# Patient Record
Sex: Female | Born: 1990 | Race: Black or African American | Hispanic: No | Marital: Single | Smoking: Current every day smoker
Health system: Southern US, Community
[De-identification: ages and names within clinical notes are randomized; demographics above are authoritative.]

## PROBLEM LIST (undated history)

## (undated) ENCOUNTER — Inpatient Hospital Stay (HOSPITAL_COMMUNITY): Payer: Self-pay

## (undated) DIAGNOSIS — A599 Trichomoniasis, unspecified: Secondary | ICD-10-CM

## (undated) DIAGNOSIS — F32A Depression, unspecified: Secondary | ICD-10-CM

## (undated) DIAGNOSIS — R58 Hemorrhage, not elsewhere classified: Secondary | ICD-10-CM

## (undated) DIAGNOSIS — J45909 Unspecified asthma, uncomplicated: Secondary | ICD-10-CM

## (undated) DIAGNOSIS — F329 Major depressive disorder, single episode, unspecified: Secondary | ICD-10-CM

## (undated) DIAGNOSIS — F419 Anxiety disorder, unspecified: Secondary | ICD-10-CM

## (undated) DIAGNOSIS — Z5189 Encounter for other specified aftercare: Secondary | ICD-10-CM

## (undated) DIAGNOSIS — B999 Unspecified infectious disease: Secondary | ICD-10-CM

## (undated) HISTORY — PX: OTHER SURGICAL HISTORY: SHX169

---

## 2015-02-09 ENCOUNTER — Encounter (HOSPITAL_COMMUNITY): Payer: Self-pay | Admitting: Emergency Medicine

## 2015-02-09 ENCOUNTER — Emergency Department (HOSPITAL_COMMUNITY)
Admission: EM | Admit: 2015-02-09 | Discharge: 2015-02-09 | Disposition: A | Discharge status: Home / Self Care | Payer: Medicaid Other | Attending: Emergency Medicine | Admitting: Emergency Medicine

## 2015-02-09 DIAGNOSIS — Z3202 Encounter for pregnancy test, result negative: Secondary | ICD-10-CM | POA: Insufficient documentation

## 2015-02-09 DIAGNOSIS — R1084 Generalized abdominal pain: Secondary | ICD-10-CM | POA: Diagnosis not present

## 2015-02-09 DIAGNOSIS — M549 Dorsalgia, unspecified: Secondary | ICD-10-CM | POA: Insufficient documentation

## 2015-02-09 DIAGNOSIS — R109 Unspecified abdominal pain: Secondary | ICD-10-CM | POA: Diagnosis present

## 2015-02-09 DIAGNOSIS — Z8659 Personal history of other mental and behavioral disorders: Secondary | ICD-10-CM | POA: Diagnosis not present

## 2015-02-09 DIAGNOSIS — J45901 Unspecified asthma with (acute) exacerbation: Secondary | ICD-10-CM | POA: Diagnosis not present

## 2015-02-09 DIAGNOSIS — Z720 Tobacco use: Secondary | ICD-10-CM | POA: Diagnosis not present

## 2015-02-09 DIAGNOSIS — R634 Abnormal weight loss: Secondary | ICD-10-CM | POA: Diagnosis not present

## 2015-02-09 HISTORY — DX: Anxiety disorder, unspecified: F41.9

## 2015-02-09 HISTORY — DX: Unspecified asthma, uncomplicated: J45.909

## 2015-02-09 LAB — COMPREHENSIVE METABOLIC PANEL
ALT: 17 U/L (ref 14–54)
AST: 23 U/L (ref 15–41)
Albumin: 4.5 g/dL (ref 3.5–5.0)
Alkaline Phosphatase: 57 U/L (ref 38–126)
Anion gap: 7 (ref 5–15)
BUN: 8 mg/dL (ref 6–20)
CHLORIDE: 105 mmol/L (ref 101–111)
CO2: 28 mmol/L (ref 22–32)
CREATININE: 0.83 mg/dL (ref 0.44–1.00)
Calcium: 9.6 mg/dL (ref 8.9–10.3)
GFR calc Af Amer: >60 mL/min (ref >60)
GLUCOSE: 102 mg/dL — AB (ref 65–99)
Potassium: 3.9 mmol/L (ref 3.5–5.1)
Sodium: 140 mmol/L (ref 135–145)
Total Bilirubin: 0.5 mg/dL (ref 0.3–1.2)
Total Protein: 7.7 g/dL (ref 6.5–8.1)

## 2015-02-09 LAB — URINALYSIS, ROUTINE W REFLEX MICROSCOPIC
BILIRUBIN URINE: NEGATIVE
GLUCOSE, UA: NEGATIVE mg/dL
Hgb urine dipstick: NEGATIVE
KETONES UR: NEGATIVE mg/dL
Leukocytes, UA: NEGATIVE
Nitrite: NEGATIVE
PH: 6 (ref 5.0–8.0)
Protein, ur: NEGATIVE mg/dL
SPECIFIC GRAVITY, URINE: 1.016 (ref 1.005–1.030)
Urobilinogen, UA: 1 mg/dL (ref 0.0–1.0)

## 2015-02-09 LAB — CBC WITH DIFFERENTIAL/PLATELET
Basophils Absolute: 0 10*3/uL (ref 0.0–0.1)
Basophils Relative: 0 %
EOS PCT: 3 %
Eosinophils Absolute: 0.2 10*3/uL (ref 0.0–0.7)
HCT: 44.4 % (ref 36.0–46.0)
Hemoglobin: 14.3 g/dL (ref 12.0–15.0)
LYMPHS ABS: 2.4 10*3/uL (ref 0.7–4.0)
LYMPHS PCT: 34 %
MCH: 27.8 pg (ref 26.0–34.0)
MCHC: 32.2 g/dL (ref 30.0–36.0)
MCV: 86.4 fL (ref 78.0–100.0)
MONO ABS: 0.6 10*3/uL (ref 0.1–1.0)
MONOS PCT: 8 %
Neutro Abs: 3.9 10*3/uL (ref 1.7–7.7)
Neutrophils Relative %: 55 %
PLATELETS: 293 10*3/uL (ref 150–400)
RBC: 5.14 MIL/uL — ABNORMAL HIGH (ref 3.87–5.11)
RDW: 15.5 % (ref 11.5–15.5)
WBC: 7.2 10*3/uL (ref 4.0–10.5)

## 2015-02-09 LAB — RAPID URINE DRUG SCREEN, HOSP PERFORMED
AMPHETAMINES: NOT DETECTED
BARBITURATES: NOT DETECTED
Benzodiazepines: NOT DETECTED
Cocaine: NOT DETECTED
Opiates: NOT DETECTED
TETRAHYDROCANNABINOL: POSITIVE — AB

## 2015-02-09 LAB — LIPASE, BLOOD: LIPASE: 22 U/L (ref 22–51)

## 2015-02-09 LAB — PREGNANCY, URINE: Preg Test, Ur: NEGATIVE

## 2015-02-09 NOTE — Discharge Instructions (Signed)

## 2015-02-09 NOTE — ED Notes (Signed)
Bed: LK44 Expected date: 02/09/15 Expected time: 6:01 AM Means of arrival: Ambulance Comments: 24 yo flank pain

## 2015-02-09 NOTE — ED Notes (Signed)
Pt escorted to discharge window. Pt verbalized understanding discharge instructions. In no acute distress.  

## 2015-02-09 NOTE — ED Notes (Signed)
md at bedside

## 2015-02-09 NOTE — ED Notes (Signed)
Pt asking for assistance with transportation. Bus pass provided

## 2015-02-09 NOTE — ED Notes (Signed)
Pt alert and oriented x4. Respirations even and unlabored, bilateral symmetrical rise and fall of chest. Skin warm and dry. In no acute distress. Denies needs.   

## 2015-02-09 NOTE — ED Notes (Signed)
rn explained all of pts lab worked came back normal, and how pt can sign up for mychart to look at results. Pt stating "I need to go to the doctor in [place], because I know something is wrong".

## 2015-02-09 NOTE — ED Notes (Signed)
Patient is complaining of back pain that started three days ago. Patient states that she has had two shots of vodka and was smoking marijuana.

## 2015-02-09 NOTE — ED Provider Notes (Signed)
CSN: [id number]     Arrival date & time 02/09/15  0610 History   First MD Initiated Contact with Patient 02/09/15 [phone]     Chief Complaint  Patient presents with  . Back Pain     (Consider location/radiation/quality/duration/timing/severity/associated sxs/prior Treatment) Patient is a 24 y.o. female presenting with back pain. The history is provided by the patient.  Back Pain Associated symptoms: abdominal pain   Associated symptoms: no numbness    patient states she has had pain in her abdomen since April. She states his been on off for the last 6 months. States began after she had a baby. States that it is dull. Is sometimes her left abdomen sometimes in the right abdomen. No with change her eating. Has had some loose stools at times. No fevers or chills. States it does hurt to take a breath. No dysuria. No vaginal bleeding or discharge. Is not associated with her menses. Nothing makes pain better. States she has not seen anyone for it. States she recently came to [place] from [place]. She states that anytime she goes to hospital he asked for a urine sample and no one is asked for her urine yet. She states she had to have cervical vaginal surgery with delivery because she was bleeding. States it was a vaginal delivery.  Past Medical History  Diagnosis Date  . Asthma   . Anxiety    History reviewed. No pertinent past surgical history. History reviewed. No pertinent family history. Social History  Substance Use Topics  . Smoking status: Current Every Day Smoker  . Smokeless tobacco: Never Used  . Alcohol Use: Yes   OB History    No data available     Review of Systems  Constitutional: Positive for unexpected weight change. Negative for appetite change.  Respiratory: Positive for shortness of breath.   Gastrointestinal: Positive for abdominal pain. Negative for nausea, vomiting and diarrhea.  Genitourinary: Positive for flank pain.  Musculoskeletal: Positive for  back pain.  Skin: Negative for wound.  Neurological: Negative for numbness.      Allergies  Review of patient's allergies indicates no known allergies.  Home Medications   Prior to Admission medications   Not on File   BP 98/63 mmHg  Pulse 68  Temp(Src) 98.7 F (37.1 C) (Oral)  Resp 18  Ht  (1.676 m)  Wt 135 lb (61.236 kg)  BMI 21.80 kg/m2  SpO2 97% Physical Exam  Constitutional: She appears well-developed and well-nourished.  HENT:  Head: Atraumatic.  Neck: Neck supple.  Cardiovascular: Normal rate.   Pulmonary/Chest: Effort normal.  Abdominal: Soft. There is no tenderness.  Musculoskeletal: Normal range of motion.  Neurological: She is alert.  Skin: Skin is warm.    ED Course  Procedures (including critical care time) Labs Review Labs Reviewed  COMPREHENSIVE METABOLIC PANEL - Abnormal; Notable for the following:    Glucose, Bld 102 (*)    All other components within normal limits  CBC WITH DIFFERENTIAL/PLATELET - Abnormal; Notable for the following:    RBC 5.14 (*)    All other components within normal limits  URINE RAPID DRUG SCREEN, HOSP PERFORMED - Abnormal; Notable for the following:    Tetrahydrocannabinol POSITIVE (*)    All other components within normal limits  LIPASE, BLOOD  URINALYSIS, ROUTINE W REFLEX MICROSCOPIC (NOT AT [facility])  PREGNANCY, URINE    Imaging Review No results found. I have personally reviewed and evaluated these images and lab results as part of my medical  decision-making.   EKG Interpretation None      MDM   Final diagnoses:  Generalized abdominal pain     patient with abdominal pain for the last 6 months. Began after delivery and reportedly abdominal surgery. Lab work reassuring. Will have follow-up with gynecology. Patient states she is new to town.    Benjiman Core, MD 02/09/15 1022

## 2015-05-03 ENCOUNTER — Emergency Department (HOSPITAL_COMMUNITY)
Admission: EM | Admit: 2015-05-03 | Discharge: 2015-05-03 | Disposition: A | Discharge status: Home / Self Care | Payer: Medicaid Other | Attending: Emergency Medicine | Admitting: Emergency Medicine

## 2015-05-03 ENCOUNTER — Emergency Department (HOSPITAL_COMMUNITY): Payer: Medicaid Other

## 2015-05-03 ENCOUNTER — Encounter (HOSPITAL_COMMUNITY): Payer: Self-pay | Admitting: Emergency Medicine

## 2015-05-03 DIAGNOSIS — F17210 Nicotine dependence, cigarettes, uncomplicated: Secondary | ICD-10-CM | POA: Insufficient documentation

## 2015-05-03 DIAGNOSIS — J45909 Unspecified asthma, uncomplicated: Secondary | ICD-10-CM | POA: Diagnosis not present

## 2015-05-03 DIAGNOSIS — M940 Chondrocostal junction syndrome [Tietze]: Secondary | ICD-10-CM | POA: Insufficient documentation

## 2015-05-03 DIAGNOSIS — Z8659 Personal history of other mental and behavioral disorders: Secondary | ICD-10-CM | POA: Diagnosis not present

## 2015-05-03 DIAGNOSIS — Z880 Allergy status to penicillin: Secondary | ICD-10-CM | POA: Diagnosis not present

## 2015-05-03 DIAGNOSIS — R05 Cough: Secondary | ICD-10-CM | POA: Insufficient documentation

## 2015-05-03 DIAGNOSIS — R059 Cough, unspecified: Secondary | ICD-10-CM

## 2015-05-03 MED ORDER — NAPROXEN 500 MG PO TABS: 500.0000 mg | ORAL_TABLET | Freq: Two times a day (BID) | ORAL | Status: DC

## 2015-05-03 MED ORDER — BENZONATATE 100 MG PO CAPS: 100.0000 mg | ORAL_CAPSULE | Freq: Three times a day (TID) | ORAL | Status: DC

## 2015-05-03 MED ORDER — BENZONATATE 100 MG PO CAPS
100.0000 mg | ORAL_CAPSULE | Freq: Once | ORAL | Status: AC
  Administered 2015-05-03: 100 mg via ORAL
  Filled 2015-05-03: qty 1

## 2015-05-03 MED ORDER — NAPROXEN 500 MG PO TABS
500.0000 mg | ORAL_TABLET | Freq: Once | ORAL | Status: DC
  Filled 2015-05-03: qty 1

## 2015-05-03 NOTE — ED Notes (Signed)
Patient transported to X-ray 

## 2015-05-03 NOTE — ED Provider Notes (Signed)
CSN: [id number]     Arrival date & time 05/03/15  2026 History  By signing my name below, I, Tracy Tyler, attest that this documentation has been prepared under the direction and in the presence of [employer], PA-C. Electronically Signed: Phillis Tyler, ED Scribe. 05/03/2015. 9:26 PM.  Chief Complaint  Patient presents with  . Cough   The history is provided by the patient. No language interpreter was used.  HPI Comments: Tracy Tyler is a 25 y.o. female brought in by EMS who presents to the Emergency Department complaining of gradually worsening, productive cough with clear sputum onset 2 weeks ago, progressively worsening today. She reports associated right sided chest wall pain that worsens with breathing and right arm tingling. She states that the tingling started 45 minutes ago. Pt has not taken anything for her symptoms. She reports that her 2 sons also have a cough. She denies fever or vomiting.  Past Medical History  Diagnosis Date  . Asthma   . Anxiety    History reviewed. No pertinent past surgical history. Family History  Problem Relation Age of Onset  . Diabetes Mother   . Hypertension Mother    Social History  Substance Use Topics  . Smoking status: Current Every Day Smoker    Types: Cigarettes  . Smokeless tobacco: Never Used  . Alcohol Use: Yes     Comment: occ   OB History    No data available     Review of Systems  Constitutional: Negative for fever.  Respiratory: Positive for cough.   Cardiovascular: Positive for chest pain (right sided chest wall pain).  Gastrointestinal: Negative for vomiting.  All other systems reviewed and are negative.   Allergies  Penicillins  Home Medications   Prior to Admission medications   Medication Sig Start Date End Date Taking? Authorizing Provider  benzonatate (TESSALON) 100 MG capsule Take 1 capsule (100 mg total) by mouth every 8 (eight) hours. 05/03/15   Antony Madura, PA-C  naproxen (NAPROSYN) 500 MG tablet Take  1 tablet (500 mg total) by mouth 2 (two) times daily. 05/03/15   Antony Madura, PA-C   BP 115/84 mmHg  Pulse 93  Temp(Src) 98.2 F (36.8 C) (Oral)  Resp 20  SpO2 99%  LMP 04/24/2015 (Exact Date)   Physical Exam  Constitutional: She is oriented to person, place, and time. She appears well-developed and well-nourished. No distress.  Nontoxic/nonseptic appearing  HENT:  Head: Normocephalic and atraumatic.  Eyes: Conjunctivae and EOM are normal. No scleral icterus.  Neck: Normal range of motion.  Cardiovascular: Normal rate, regular rhythm and intact distal pulses.   Pulmonary/Chest: Effort normal and breath sounds normal. No respiratory distress. She has no wheezes. She has no rales. She exhibits tenderness and bony tenderness.    Lungs clear bilaterally. Chest expansion symmetric. Tenderness to palpation to the right lower chest wall along the anterior axillary line. No bony deformity or crepitus. No cough appreciated at bedside  Abdominal: Soft. She exhibits no distension. There is no tenderness. There is no rebound.  Soft, nontender  Musculoskeletal: Normal range of motion.  Neurological: She is alert and oriented to person, place, and time. She exhibits normal muscle tone. Coordination normal.  GCS 15. Patient ambulatory with steady gait.  Skin: Skin is warm and dry. No rash noted. She is not diaphoretic. No erythema. No pallor.  Psychiatric: She has a normal mood and affect. Her behavior is normal.  Nursing note and vitals reviewed.   ED Course  Procedures (  including critical care time) DIAGNOSTIC STUDIES: Oxygen Saturation is 99% on RA, normal by my interpretation.    COORDINATION OF CARE: 8:47 PM-Discussed treatment plan which includes chest x-ray with pt at bedside and pt agreed to plan.   Labs Review Labs Reviewed - No data to display  Imaging Review Dg Chest 2 View  05/03/2015  CLINICAL DATA:  Cough starting this morning. Right arm numbness and back pain. Chest pain.  Right flank pain. EXAM: CHEST  2 VIEW COMPARISON:  None. FINDINGS: The heart size and mediastinal contours are within normal limits. Both lungs are clear. The visualized skeletal structures are unremarkable. IMPRESSION: No active cardiopulmonary disease. Electronically Signed   By: Gaylyn RongWalter  Liebkemann M.D.   On: 05/03/2015 21:20     I have personally reviewed and evaluated these images and lab results as part of my medical decision-making.   EKG Interpretation None      MDM   Final diagnoses:  Costochondritis  Cough    Pt CXR negative for acute infiltrate. Patients symptoms are consistent with URI, likely viral etiology, with secondary costochondritis. Hx of sick contacts. Patient will be discharged with symptomatic treatment. Patient verbalizes understanding and is agreeable with plan. Pt is hemodynamically stable and in NAD prior to discharge. Return precautions given.  I personally performed the services described in this documentation, which was scribed in my presence. The recorded information has been reviewed and is accurate.    Filed Vitals:   05/03/15 2030 05/03/15 2038  BP:  115/84  Pulse:  93  Temp:  98.2 F (36.8 C)  TempSrc:  Oral  Resp:  20  SpO2: 99% 99%    Antony MaduraKelly Arantxa Piercey, PA-C 05/03/15 2127   2135 - Patient left with discharge papers and WITHOUT prescriptions saying, "Oh, I don't want none of that". She asked whether this was the "only hospital in [place]", to which [facility]. Patient given list of hospitals in the area. Discharged in good condition, no distress. No cough noted during entirety of ED stay.  Antony MaduraKelly Gemini Bunte, PA-C 05/03/15 2136  Eber HongBrian Miller, MD 05/03/15 [phone]

## 2015-05-03 NOTE — ED Notes (Signed)
Per EMS pt states she woke up with a cough around 2am this morning  Pt states she has not been able to go back to sleep since then  Pt states she has 3 children in the house that have had coughs as well  Pt states about 45 minutes ago she started having right flank, back pain with right arm numbness  Pt has full ROM to that arm

## 2015-05-03 NOTE — ED Notes (Signed)
Patient refused both prescriptions. PA-C made aware of same. Prescriptions voided and placed in shred box.

## 2015-05-03 NOTE — Discharge Instructions (Signed)
Your chest x-ray does not show a pneumonia or collapsed lung. There is no fluid around your lungs on Xray. Your pain is likely due to costochondritis from frequent coughing. Take naproxen as prescribed for symptoms and Tessalon for cough management. Follow-up with a primary care doctor as needed if symptoms persist.  Costochondritis Costochondritis, sometimes called Tietze syndrome, is a swelling and irritation (inflammation) of the tissue (cartilage) that connects your ribs with your breastbone (sternum). It causes pain in the chest and rib area. Costochondritis usually goes away on its own over time. It can take up to 6 weeks or longer to get better, especially if you are unable to limit your activities. CAUSES  Some cases of costochondritis have no known cause. Possible causes include:  Injury (trauma).  Exercise or activity such as lifting.  Severe coughing. SIGNS AND SYMPTOMS  Pain and tenderness in the chest and rib area.  Pain that gets worse when coughing or taking deep breaths.  Pain that gets worse with specific movements. DIAGNOSIS  Your health care provider will do a physical exam and ask about your symptoms. Chest X-rays or other tests may be done to rule out other problems. TREATMENT  Costochondritis usually goes away on its own over time. Your health care provider may prescribe medicine to help relieve pain. HOME CARE INSTRUCTIONS   Avoid exhausting physical activity. Try not to strain your ribs during normal activity. This would include any activities using chest, abdominal, and side muscles, especially if heavy weights are used.  Apply ice to the affected area for the first 2 days after the pain begins.  Put ice in a plastic bag.  Place a towel between your skin and the bag.  Leave the ice on for 20 minutes, 2-3 times a day.  Only take over-the-counter or prescription medicines as directed by your health care provider. SEEK MEDICAL CARE IF:  You have redness or  swelling at the rib joints. These are signs of infection.  Your pain does not go away despite rest or medicine. SEEK IMMEDIATE MEDICAL CARE IF:   Your pain increases or you are very uncomfortable.  You have shortness of breath or difficulty breathing.  You cough up blood.  You have worse chest pains, sweating, or vomiting.  You have a fever or persistent symptoms for more than 2-3 days.  You have a fever and your symptoms suddenly get worse. MAKE SURE YOU:   Understand these instructions.  Will watch your condition.  Will get help right away if you are not doing well or get worse.   This information is not intended to replace advice given to you by your health care provider. Make sure you discuss any questions you have with your health care provider.   Document Released: 01/26/2005 Document Revised: 02/06/2013 Document Reviewed: 11/20/2012 Elsevier Interactive Patient Education 2016 Elsevier Inc. Cough, Adult A cough helps to clear your throat and lungs. A cough may last only 2-3 weeks (acute), or it may last longer than 8 weeks (chronic). Many different things can cause a cough. A cough may be a sign of an illness or another medical condition. HOME CARE  Pay attention to any changes in your cough.  Take medicines only as told by your doctor.  If you were prescribed an antibiotic medicine, take it as told by your doctor. Do not stop taking it even if you start to feel better.  Talk with your doctor before you try using a cough medicine.  Drink enough fluid  to keep your pee (urine) clear or pale yellow.  If the air is dry, use a cold steam vaporizer or humidifier in your home.  Stay away from things that make you cough at work or at home.  If your cough is worse at night, try using extra pillows to raise your head up higher while you sleep.  Do not smoke, and try not to be around smoke. If you need help quitting, ask your doctor.  Do not have caffeine.  Do not  drink alcohol.  Rest as needed. GET HELP IF:  You have new problems (symptoms).  You cough up yellow fluid (pus).  Your cough does not get better after 2-3 weeks, or your cough gets worse.  Medicine does not help your cough and you are not sleeping well.  You have pain that gets worse or pain that is not helped with medicine.  You have a fever.  You are losing weight and you do not know why.  You have night sweats. GET HELP RIGHT AWAY IF:  You cough up blood.  You have trouble breathing.  Your heartbeat is very fast.   This information is not intended to replace advice given to you by your health care provider. Make sure you discuss any questions you have with your health care provider.   Document Released: 12/30/2010 Document Revised: 01/07/2015 Document Reviewed: 06/25/2014 Elsevier Interactive Patient Education [employer]

## 2015-05-20 ENCOUNTER — Encounter: Payer: Medicaid Other | Admitting: Obstetrics & Gynecology

## 2015-06-19 ENCOUNTER — Encounter (HOSPITAL_COMMUNITY): Payer: Self-pay | Admitting: Emergency Medicine

## 2015-06-19 ENCOUNTER — Emergency Department (HOSPITAL_COMMUNITY)
Admission: EM | Admit: 2015-06-19 | Discharge: 2015-06-19 | Disposition: A | Discharge status: Home / Self Care | Payer: Medicaid Other | Attending: Emergency Medicine | Admitting: Emergency Medicine

## 2015-06-19 DIAGNOSIS — R21 Rash and other nonspecific skin eruption: Secondary | ICD-10-CM | POA: Diagnosis present

## 2015-06-19 DIAGNOSIS — J45909 Unspecified asthma, uncomplicated: Secondary | ICD-10-CM | POA: Diagnosis not present

## 2015-06-19 DIAGNOSIS — R238 Other skin changes: Secondary | ICD-10-CM

## 2015-06-19 DIAGNOSIS — F17210 Nicotine dependence, cigarettes, uncomplicated: Secondary | ICD-10-CM | POA: Diagnosis not present

## 2015-06-19 DIAGNOSIS — L0889 Other specified local infections of the skin and subcutaneous tissue: Secondary | ICD-10-CM | POA: Insufficient documentation

## 2015-06-19 DIAGNOSIS — Z8659 Personal history of other mental and behavioral disorders: Secondary | ICD-10-CM | POA: Insufficient documentation

## 2015-06-19 DIAGNOSIS — Z880 Allergy status to penicillin: Secondary | ICD-10-CM | POA: Diagnosis not present

## 2015-06-19 HISTORY — DX: Depression, unspecified: F32.A

## 2015-06-19 HISTORY — DX: Major depressive disorder, single episode, unspecified: F32.9

## 2015-06-19 MED ORDER — FLUORESCEIN SODIUM 1 MG OP STRP
1.0000 | ORAL_STRIP | Freq: Once | OPHTHALMIC | Status: AC
  Administered 2015-06-19: 1 via OPHTHALMIC
  Filled 2015-06-19: qty 1

## 2015-06-19 MED ORDER — HYDROCORTISONE 1 % EX CREA: TOPICAL_CREAM | CUTANEOUS | Status: DC

## 2015-06-19 MED ORDER — TETRACAINE HCL 0.5 % OP SOLN
2.0000 [drp] | Freq: Once | OPHTHALMIC | Status: AC
  Administered 2015-06-19: 2 [drp] via OPHTHALMIC
  Filled 2015-06-19: qty 2

## 2015-06-19 NOTE — Discharge Instructions (Signed)
Use the hydrocortisone cream on the area of redness on your cheek three times a day. Return to ED with spreading redness, severe eye pain, painful eye movements, redness of the eye, swelling of the eyelids, fevers, chills or any other new, worsening or concerning symptoms.   Rash    A rash is a change in the color or texture of the skin. There are many different types of rashes. You may have other problems that accompany your rash.  CAUSES  Infections.  Allergic reactions. This can include allergies to pets or foods.  Certain medicines.  Exposure to certain chemicals, soaps, or cosmetics.  Heat.  Exposure to poisonous plants.  Tumors, both cancerous and noncancerous. SYMPTOMS  Redness.  Scaly skin.  Itchy skin.  Dry or cracked skin.  Bumps.  Blisters.  Pain. DIAGNOSIS  Your caregiver may do a physical exam to determine what type of rash you have. A skin sample (biopsy) may be taken and examined under a microscope.  TREATMENT  Treatment depends on the type of rash you have. Your caregiver may prescribe certain medicines. For serious conditions, you may need to see a skin doctor (dermatologist).  HOME CARE INSTRUCTIONS  Avoid the substance that caused your rash.  Do not scratch your rash. This can cause infection.  You may take cool baths to help stop itching.  Only take over-the-counter or prescription medicines as directed by your caregiver.  Keep all follow-up appointments as directed by your caregiver. SEEK IMMEDIATE MEDICAL CARE IF:  You have increasing pain, swelling, or redness.  You have a fever.  You have new or severe symptoms.  You have body aches, diarrhea, or vomiting.  Your rash is not better after 3 days. MAKE SURE YOU:  Understand these instructions.  Will watch your condition.  Will get help right away if you are not doing well or get worse. This information is not intended to replace advice given to you by your health care provider. Make sure you discuss any  questions you have with your health care provider.  Document Released: 04/08/2002 Document Revised: 05/09/2014 Document Reviewed: 09/03/2014  Elsevier Interactive Patient Education [employer]

## 2015-06-19 NOTE — ED Notes (Signed)
Started this am with itching, red rash below left eye.
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
## 2015-06-19 NOTE — ED Provider Notes (Signed)
  Face-to-face evaluation   History: She presents for evaluation of rash, left cheek, which started this morning. No known trauma, it itches, it is not draining.  Physical exam: 2 x 3 cm red raised area over the left zygoma, with very minimal skin colored elevations, 1 mm within this. There is no fluctuance or drainage. There are no vesicles. There is no trismus. External ocular muscle movements are intact. There is no abnormalities of the left upper or lower eyelids  Medical screening examination/treatment/procedure(s) were conducted as a shared visit with non-physician practitioner(s) and myself.  I personally evaluated the patient during the encounter  Mancel Bale, MD 06/20/15 [phone]

## 2015-06-23 ENCOUNTER — Encounter (HOSPITAL_COMMUNITY): Payer: Self-pay | Admitting: Emergency Medicine

## 2015-06-23 ENCOUNTER — Emergency Department (HOSPITAL_COMMUNITY)
Admission: EM | Admit: 2015-06-23 | Discharge: 2015-06-23 | Disposition: A | Discharge status: Home / Self Care | Payer: Medicaid Other | Attending: Emergency Medicine | Admitting: Emergency Medicine

## 2015-06-23 DIAGNOSIS — J45909 Unspecified asthma, uncomplicated: Secondary | ICD-10-CM | POA: Insufficient documentation

## 2015-06-23 DIAGNOSIS — F17210 Nicotine dependence, cigarettes, uncomplicated: Secondary | ICD-10-CM | POA: Diagnosis not present

## 2015-06-23 DIAGNOSIS — Z79899 Other long term (current) drug therapy: Secondary | ICD-10-CM | POA: Diagnosis not present

## 2015-06-23 DIAGNOSIS — R1030 Lower abdominal pain, unspecified: Secondary | ICD-10-CM | POA: Diagnosis present

## 2015-06-23 DIAGNOSIS — Z7952 Long term (current) use of systemic steroids: Secondary | ICD-10-CM | POA: Diagnosis not present

## 2015-06-23 DIAGNOSIS — A5900 Urogenital trichomoniasis, unspecified: Secondary | ICD-10-CM | POA: Diagnosis not present

## 2015-06-23 DIAGNOSIS — B9689 Other specified bacterial agents as the cause of diseases classified elsewhere: Secondary | ICD-10-CM

## 2015-06-23 DIAGNOSIS — Z880 Allergy status to penicillin: Secondary | ICD-10-CM | POA: Diagnosis not present

## 2015-06-23 DIAGNOSIS — Z8659 Personal history of other mental and behavioral disorders: Secondary | ICD-10-CM | POA: Diagnosis not present

## 2015-06-23 DIAGNOSIS — Z3202 Encounter for pregnancy test, result negative: Secondary | ICD-10-CM | POA: Diagnosis not present

## 2015-06-23 DIAGNOSIS — N390 Urinary tract infection, site not specified: Secondary | ICD-10-CM | POA: Diagnosis not present

## 2015-06-23 DIAGNOSIS — N760 Acute vaginitis: Secondary | ICD-10-CM | POA: Diagnosis not present

## 2015-06-23 DIAGNOSIS — A5901 Trichomonal vulvovaginitis: Secondary | ICD-10-CM

## 2015-06-23 LAB — WET PREP, GENITAL
Sperm: NONE SEEN
Yeast Wet Prep HPF POC: NONE SEEN

## 2015-06-23 LAB — URINALYSIS, ROUTINE W REFLEX MICROSCOPIC
BILIRUBIN URINE: NEGATIVE
GLUCOSE, UA: NEGATIVE mg/dL
HGB URINE DIPSTICK: NEGATIVE
KETONES UR: NEGATIVE mg/dL
Nitrite: NEGATIVE
PROTEIN: NEGATIVE mg/dL
Specific Gravity, Urine: 1.011 (ref 1.005–1.030)
pH: 6 (ref 5.0–8.0)

## 2015-06-23 LAB — POC URINE PREG, ED: PREG TEST UR: NEGATIVE

## 2015-06-23 LAB — URINE MICROSCOPIC-ADD ON

## 2015-06-23 MED ORDER — CEFTRIAXONE SODIUM 250 MG IJ SOLR
250.0000 mg | Freq: Once | INTRAMUSCULAR | Status: AC
  Administered 2015-06-23: 250 mg via INTRAMUSCULAR
  Filled 2015-06-23: qty 250

## 2015-06-23 MED ORDER — CEPHALEXIN 500 MG PO CAPS: 500.0000 mg | ORAL_CAPSULE | Freq: Four times a day (QID) | ORAL | Status: DC

## 2015-06-23 MED ORDER — AZITHROMYCIN 250 MG PO TABS
1000.0000 mg | ORAL_TABLET | Freq: Once | ORAL | Status: AC
  Administered 2015-06-23: 1000 mg via ORAL
  Filled 2015-06-23: qty 4

## 2015-06-23 MED ORDER — METRONIDAZOLE 500 MG PO TABS: 500.0000 mg | ORAL_TABLET | Freq: Two times a day (BID) | ORAL | Status: DC

## 2015-06-23 NOTE — Discharge Instructions (Signed)
Bacterial Vaginosis Bacterial vaginosis is a vaginal infection that occurs when the normal balance of bacteria in the vagina is disrupted. It results from an overgrowth of certain bacteria. This is the most common vaginal infection in women of childbearing age. Treatment is important to prevent complications, especially in pregnant women, as it can cause a premature delivery. CAUSES  Bacterial vaginosis is caused by an increase in harmful bacteria that are normally present in smaller amounts in the vagina. Several different kinds of bacteria can cause bacterial vaginosis. However, the reason that the condition develops is not fully understood. RISK FACTORS Certain activities or behaviors can put you at an increased risk of developing bacterial vaginosis, including:  Having a new sex partner or multiple sex partners.  Douching.  Using an intrauterine device (IUD) for contraception. Women do not get bacterial vaginosis from toilet seats, bedding, swimming pools, or contact with objects around them. SIGNS AND SYMPTOMS  Some women with bacterial vaginosis have no signs or symptoms. Common symptoms include:  Grey vaginal discharge.  A fishlike odor with discharge, especially after sexual intercourse.  Itching or burning of the vagina and vulva.  Burning or pain with urination. DIAGNOSIS  Your health care provider will take a medical history and examine the vagina for signs of bacterial vaginosis. A sample of vaginal fluid may be taken. Your health care provider will look at this sample under a microscope to check for bacteria and abnormal cells. A vaginal pH test may also be done.  TREATMENT  Bacterial vaginosis may be treated with antibiotic medicines. These may be given in the form of a pill or a vaginal cream. A second round of antibiotics may be prescribed if the condition comes back after treatment. Because bacterial vaginosis increases your risk for sexually transmitted diseases, getting  treated can help reduce your risk for chlamydia, gonorrhea, HIV, and herpes. HOME CARE INSTRUCTIONS   Only take over-the-counter or prescription medicines as directed by your health care provider.  If antibiotic medicine was prescribed, take it as directed. Make sure you finish it even if you start to feel better.  Tell all sexual partners that you have a vaginal infection. They should see their health care provider and be treated if they have problems, such as a mild rash or itching.  During treatment, it is important that you follow these instructions:  Avoid sexual activity or use condoms correctly.  Do not douche.  Avoid alcohol as directed by your health care provider.  Avoid breastfeeding as directed by your health care provider. SEEK MEDICAL CARE IF:   Your symptoms are not improving after 3 days of treatment.  You have increased discharge or pain.  You have a fever. MAKE SURE YOU:   Understand these instructions.  Will watch your condition.  Will get help right away if you are not doing well or get worse. FOR MORE INFORMATION  Centers for Disease Control and Prevention, Division of STD Prevention: [employer] American Sexual Health Association (ASHA): www.ashastd.org    This information is not intended to replace advice given to you by your health care provider. Make sure you discuss any questions you have with your health care provider.   Document Released: 04/18/2005 Document Revised: 05/09/2014 Document Reviewed: 11/28/2012 Elsevier Interactive Patient Education 2016 [employer].  Trichomoniasis Trichomoniasis is an infection caused by an organism called Trichomonas. The infection can affect both women and men. In women, the outer female genitalia and the vagina are affected. In men, the penis is mainly  affected, but the prostate and other reproductive organs can also be involved. Trichomoniasis is a sexually transmitted infection (STI) and is most often  passed to another person through sexual contact.  RISK FACTORS  Having unprotected sexual intercourse.  Having sexual intercourse with an infected partner. SIGNS AND SYMPTOMS  Symptoms of trichomoniasis in women include:  Abnormal gray-green frothy vaginal discharge.  Itching and irritation of the vagina.  Itching and irritation of the area outside the vagina. Symptoms of trichomoniasis in men include:   Penile discharge with or without pain.  Pain during urination. This results from inflammation of the urethra. DIAGNOSIS  Trichomoniasis may be found during a Pap test or physical exam. Your health care provider may use one of the following methods to help diagnose this infection:  Testing the pH of the vagina with a test tape.  Using a vaginal swab test that checks for the Trichomonas organism. A test is available that provides results within a few minutes.  Examining a urine sample.  Testing vaginal secretions. Your health care provider may test you for other STIs, including HIV. TREATMENT   You may be given medicine to fight the infection. Women should inform their health care provider if they could be or are pregnant. Some medicines used to treat the infection should not be taken during pregnancy.  Your health care provider may recommend over-the-counter medicines or creams to decrease itching or irritation.  Your sexual partner will need to be treated if infected.  Your health care provider may test you for infection again 3 months after treatment. HOME CARE INSTRUCTIONS   Take medicines only as directed by your health care provider.  Take over-the-counter medicine for itching or irritation as directed by your health care provider.  Do not have sexual intercourse while you have the infection.  Women should not douche or wear tampons while they have the infection.  Discuss your infection with your partner. Your partner may have gotten the infection from you, or you  may have gotten it from your partner.  Have your sex partner get examined and treated if necessary.  Practice safe, informed, and protected sex.  See your health care provider for other STI testing. SEEK MEDICAL CARE IF:   You still have symptoms after you finish your medicine.  You develop abdominal pain.  You have pain when you urinate.  You have bleeding after sexual intercourse.  You develop a rash.  Your medicine makes you sick or makes you throw up (vomit). MAKE SURE YOU:  Understand these instructions.  Will watch your condition.  Will get help right away if you are not doing well or get worse.   This information is not intended to replace advice given to you by your health care provider. Make sure you discuss any questions you have with your health care provider.   Document Released: 10/12/2000 Document Revised: 05/09/2014 Document Reviewed: 01/28/2013 Elsevier Interactive Patient Education 2016 [employer] Inc.  Urinary Tract Infection Urinary tract infections (UTIs) can develop anywhere along your urinary tract. Your urinary tract is your body's drainage system for removing wastes and extra water. Your urinary tract includes two kidneys, two ureters, a bladder, and a urethra. Your kidneys are a pair of bean-shaped organs. Each kidney is about the size of your fist. They are located below your ribs, one on each side of your spine. CAUSES Infections are caused by microbes, which are microscopic organisms, including fungi, viruses, and bacteria. These organisms are so small that they can  only be seen through a microscope. Bacteria are the microbes that most commonly cause UTIs. SYMPTOMS  Symptoms of UTIs may vary by age and gender of the patient and by the location of the infection. Symptoms in young women typically include a frequent and intense urge to urinate and a painful, burning feeling in the bladder or urethra during urination. Older women and men are more likely to  be tired, shaky, and weak and have muscle aches and abdominal pain. A fever may mean the infection is in your kidneys. Other symptoms of a kidney infection include pain in your back or sides below the ribs, nausea, and vomiting. DIAGNOSIS To diagnose a UTI, your caregiver will ask you about your symptoms. Your caregiver will also ask you to provide a urine sample. The urine sample will be tested for bacteria and white blood cells. White blood cells are made by your body to help fight infection. TREATMENT  Typically, UTIs can be treated with medication. Because most UTIs are caused by a bacterial infection, they usually can be treated with the use of antibiotics. The choice of antibiotic and length of treatment depend on your symptoms and the type of bacteria causing your infection. HOME CARE INSTRUCTIONS  If you were prescribed antibiotics, take them exactly as your caregiver instructs you. Finish the medication even if you feel better after you have only taken some of the medication.  Drink enough water and fluids to keep your urine clear or pale yellow.  Avoid caffeine, tea, and carbonated beverages. They tend to irritate your bladder.  Empty your bladder often. Avoid holding urine for long periods of time.  Empty your bladder before and after sexual intercourse.  After a bowel movement, women should cleanse from front to back. Use each tissue only once. SEEK MEDICAL CARE IF:   You have back pain.  You develop a fever.  Your symptoms do not begin to resolve within 3 days. SEEK IMMEDIATE MEDICAL CARE IF:   You have severe back pain or lower abdominal pain.  You develop chills.  You have nausea or vomiting.  You have continued burning or discomfort with urination. MAKE SURE YOU:   Understand these instructions.  Will watch your condition.  Will get help right away if you are not doing well or get worse.   This information is not intended to replace advice given to you by  your health care provider. Make sure you discuss any questions you have with your health care provider.   Document Released: 01/26/2005 Document Revised: 01/07/2015 Document Reviewed: 05/27/2011 Elsevier Interactive Patient Education [employer]

## 2015-06-23 NOTE — ED Notes (Signed)
Pt sts lower abd pain with vaginal discharge and dysuria; pt sts LMP was 05/25/15
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
## 2015-06-24 LAB — GC/CHLAMYDIA PROBE AMP (~~LOC~~) NOT AT ARMC
CHLAMYDIA, DNA PROBE: POSITIVE — AB
NEISSERIA GONORRHEA: NEGATIVE

## 2015-06-25 ENCOUNTER — Telehealth (HOSPITAL_BASED_OUTPATIENT_CLINIC_OR_DEPARTMENT_OTHER): Payer: Self-pay | Admitting: Emergency Medicine

## 2016-01-20 ENCOUNTER — Emergency Department (HOSPITAL_COMMUNITY): Payer: Medicaid Other

## 2016-01-20 ENCOUNTER — Emergency Department (HOSPITAL_COMMUNITY)
Admission: EM | Admit: 2016-01-20 | Discharge: 2016-01-20 | Disposition: A | Discharge status: Home / Self Care | Payer: Medicaid Other | Attending: Emergency Medicine | Admitting: Emergency Medicine

## 2016-01-20 ENCOUNTER — Encounter (HOSPITAL_COMMUNITY): Payer: Self-pay | Admitting: Emergency Medicine

## 2016-01-20 DIAGNOSIS — J45909 Unspecified asthma, uncomplicated: Secondary | ICD-10-CM | POA: Diagnosis not present

## 2016-01-20 DIAGNOSIS — Y929 Unspecified place or not applicable: Secondary | ICD-10-CM | POA: Insufficient documentation

## 2016-01-20 DIAGNOSIS — S40021A Contusion of right upper arm, initial encounter: Secondary | ICD-10-CM

## 2016-01-20 DIAGNOSIS — F17210 Nicotine dependence, cigarettes, uncomplicated: Secondary | ICD-10-CM | POA: Insufficient documentation

## 2016-01-20 DIAGNOSIS — Y999 Unspecified external cause status: Secondary | ICD-10-CM | POA: Insufficient documentation

## 2016-01-20 DIAGNOSIS — Y9301 Activity, walking, marching and hiking: Secondary | ICD-10-CM | POA: Insufficient documentation

## 2016-01-20 DIAGNOSIS — S59911A Unspecified injury of right forearm, initial encounter: Secondary | ICD-10-CM | POA: Diagnosis present

## 2016-01-20 DIAGNOSIS — M79601 Pain in right arm: Secondary | ICD-10-CM

## 2016-01-20 DIAGNOSIS — W109XXA Fall (on) (from) unspecified stairs and steps, initial encounter: Secondary | ICD-10-CM | POA: Insufficient documentation

## 2016-01-20 DIAGNOSIS — S5011XA Contusion of right forearm, initial encounter: Secondary | ICD-10-CM | POA: Insufficient documentation

## 2016-01-20 NOTE — ED Triage Notes (Signed)
Pt sts fell down stairs yesterday and having right arm pain at elbow/forearm area
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
## 2016-01-20 NOTE — Discharge Instructions (Signed)
Wear ace wrap and use cling as needed for comfort, but make sure to continue performing range of motion exercises of your shoulder/elbow/wrist to prevent stiffness. Ice and elevate arm throughout the day, using ice pack for no more than 20 minutes every hour.  Alternate between tylenol and motrin as needed for pain. Call orthopedic specialist follow up today or tomorrow to schedule followup appointment for recheck of ongoing wrist pain in 1-2 weeks that can be canceled with a 24-48 hour notice if complete resolution of pain. Return to the ER for changes or worsening symptoms.

## 2016-02-13 ENCOUNTER — Emergency Department (HOSPITAL_COMMUNITY)
Admission: EM | Admit: 2016-02-13 | Discharge: 2016-02-13 | Discharge status: Against Medical Advice | Payer: Medicaid Other

## 2016-02-13 NOTE — ED Notes (Signed)
Patient left the triage room while this nurse was getting report.  When called no answer and registration stated they saw her walk out using her phone.  This nurse went looking for her without success
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
## 2016-06-28 ENCOUNTER — Other Ambulatory Visit: Payer: Self-pay | Admitting: Advanced Practice Midwife

## 2016-06-28 ENCOUNTER — Encounter (HOSPITAL_COMMUNITY): Payer: Self-pay | Admitting: *Deleted

## 2016-06-28 ENCOUNTER — Inpatient Hospital Stay (HOSPITAL_COMMUNITY)
Admission: AD | Admit: 2016-06-28 | Discharge: 2016-06-28 | Disposition: A | Discharge status: Home / Self Care | Payer: Medicaid Other | Source: Ambulatory Visit | Attending: Family Medicine | Admitting: Family Medicine

## 2016-06-28 ENCOUNTER — Other Ambulatory Visit: Payer: Self-pay

## 2016-06-28 ENCOUNTER — Telehealth: Payer: Self-pay

## 2016-06-28 DIAGNOSIS — O0930 Supervision of pregnancy with insufficient antenatal care, unspecified trimester: Secondary | ICD-10-CM | POA: Diagnosis not present

## 2016-06-28 DIAGNOSIS — O0932 Supervision of pregnancy with insufficient antenatal care, second trimester: Secondary | ICD-10-CM

## 2016-06-28 DIAGNOSIS — N912 Amenorrhea, unspecified: Secondary | ICD-10-CM | POA: Diagnosis present

## 2016-06-28 DIAGNOSIS — Z3687 Encounter for antenatal screening for uncertain dates: Secondary | ICD-10-CM | POA: Insufficient documentation

## 2016-06-28 DIAGNOSIS — O09892 Supervision of other high risk pregnancies, second trimester: Secondary | ICD-10-CM

## 2016-06-28 DIAGNOSIS — O09212 Supervision of pregnancy with history of pre-term labor, second trimester: Secondary | ICD-10-CM | POA: Diagnosis not present

## 2016-06-28 DIAGNOSIS — Z880 Allergy status to penicillin: Secondary | ICD-10-CM | POA: Diagnosis not present

## 2016-06-28 DIAGNOSIS — Z3492 Encounter for supervision of normal pregnancy, unspecified, second trimester: Secondary | ICD-10-CM

## 2016-06-28 DIAGNOSIS — O09899 Supervision of other high risk pregnancies, unspecified trimester: Secondary | ICD-10-CM

## 2016-06-28 DIAGNOSIS — O09219 Supervision of pregnancy with history of pre-term labor, unspecified trimester: Secondary | ICD-10-CM

## 2016-06-28 HISTORY — DX: Trichomoniasis, unspecified: A59.9

## 2016-06-28 HISTORY — DX: Unspecified infectious disease: B99.9

## 2016-06-28 MED ORDER — CONCEPT OB 130-92.4-1 MG PO CAPS: 1.0000 | ORAL_CAPSULE | Freq: Every day | ORAL | 12 refills | Status: AC

## 2016-06-28 NOTE — Telephone Encounter (Signed)
Patient has been scheduled for a lab draw today and Makena application has been sent off.

## 2016-06-28 NOTE — Discharge Instructions (Signed)
Second Trimester of Pregnancy The second trimester is from week 14 through week 27 (months 4 through 6). The second trimester is often a time when you feel your best. Your body has adjusted to being pregnant, and you begin to feel better physically. Usually, morning sickness has lessened or quit completely, you may have more energy, and you may have an increase in appetite. The second trimester is also a time when the fetus is growing rapidly. At the end of the sixth month, the fetus is about 9 inches long and weighs about 1 pounds. You will likely begin to feel the baby move (quickening) between 16 and 20 weeks of pregnancy. Body changes during your second trimester Your body continues to go through many changes during your second trimester. The changes vary from woman to woman.  Your weight will continue to increase. You will notice your lower abdomen bulging out.  You may begin to get stretch marks on your hips, abdomen, and breasts.  You may develop headaches that can be relieved by medicines. The medicines should be approved by your health care provider.  You may urinate more often because the fetus is pressing on your bladder.  You may develop or continue to have heartburn as a result of your pregnancy.  You may develop constipation because certain hormones are causing the muscles that push waste through your intestines to slow down.  You may develop hemorrhoids or swollen, bulging veins (varicose veins).  You may have back pain. This is caused by:  Weight gain.  Pregnancy hormones that are relaxing the joints in your pelvis.  A shift in weight and the muscles that support your balance.  Your breasts will continue to grow and they will continue to become tender.  Your gums may bleed and may be sensitive to brushing and flossing.  Dark spots or blotches (chloasma, mask of pregnancy) may develop on your face. This will likely fade after the baby is born.  A dark line from your  belly button to the pubic area (linea nigra) may appear. This will likely fade after the baby is born.  You may have changes in your hair. These can include thickening of your hair, rapid growth, and changes in texture. Some women also have hair loss during or after pregnancy, or hair that feels dry or thin. Your hair will most likely return to normal after your baby is born. What to expect at prenatal visits During a routine prenatal visit:  You will be weighed to make sure you and the fetus are growing normally.  Your blood pressure will be taken.  Your abdomen will be measured to track your baby's growth.  The fetal heartbeat will be listened to.  Any test results from the previous visit will be discussed. Your health care provider may ask you:  How you are feeling.  If you are feeling the baby move.  If you have had any abnormal symptoms, such as leaking fluid, bleeding, severe headaches, or abdominal cramping.  If you are using any tobacco products, including cigarettes, chewing tobacco, and electronic cigarettes.  If you have any questions. Other tests that may be performed during your second trimester include:  Blood tests that check for:  Low iron levels (anemia).  High blood sugar that affects pregnant women (gestational diabetes) between 78 and 28 weeks.  Rh antibodies. This is to check for a protein on red blood cells (Rh factor).  Urine tests to check for infections, diabetes, or protein in the  urine.  An ultrasound to confirm the proper growth and development of the baby.  An amniocentesis to check for possible genetic problems.  Fetal screens for spina bifida and Down syndrome.  HIV (human immunodeficiency virus) testing. Routine prenatal testing includes screening for HIV, unless you choose not to have this test. Follow these instructions at home: Medicines   Follow your health care provider's instructions regarding medicine use. Specific medicines may  be either safe or unsafe to take during pregnancy.  Take a prenatal vitamin that contains at least 600 micrograms (mcg) of folic acid.  If you develop constipation, try taking a stool softener if your health care provider approves. Eating and drinking   Eat a balanced diet that includes fresh fruits and vegetables, whole grains, good sources of protein such as meat, eggs, or tofu, and low-fat dairy. Your health care provider will help you determine the amount of weight gain that is right for you.  Avoid raw meat and uncooked cheese. These carry germs that can cause birth defects in the baby.  If you have low calcium intake from food, talk to your health care provider about whether you should take a daily calcium supplement.  Limit foods that are high in fat and processed sugars, such as fried and sweet foods.  To prevent constipation:  Drink enough fluid to keep your urine clear or pale yellow.  Eat foods that are high in fiber, such as fresh fruits and vegetables, whole grains, and beans. Activity   Exercise only as directed by your health care provider. Most women can continue their usual exercise routine during pregnancy. Try to exercise for 30 minutes at least 5 days a week. Stop exercising if you experience uterine contractions.  Avoid heavy lifting, wear low heel shoes, and practice good posture.  A sexual relationship may be continued unless your health care provider directs you otherwise. Relieving pain and discomfort   Wear a good support bra to prevent discomfort from breast tenderness.  Take warm sitz baths to soothe any pain or discomfort caused by hemorrhoids. Use hemorrhoid cream if your health care provider approves.  Rest with your legs elevated if you have leg cramps or low back pain.  If you develop varicose veins, wear support hose. Elevate your feet for 15 minutes, 3-4 times a day. Limit salt in your diet. Prenatal Care   Write down your questions. Take them  to your prenatal visits.  Keep all your prenatal visits as told by your health care provider. This is important. Safety   Wear your seat belt at all times when driving.  Make a list of emergency phone numbers, including numbers for family, friends, the hospital, and police and fire departments. General instructions   Ask your health care provider for a referral to a local prenatal education class. Begin classes no later than the beginning of month 6 of your pregnancy.  Ask for help if you have counseling or nutritional needs during pregnancy. Your health care provider can offer advice or refer you to specialists for help with various needs.  Do not use hot tubs, steam rooms, or saunas.  Do not douche or use tampons or scented sanitary pads.  Do not cross your legs for long periods of time.  Avoid cat litter boxes and soil used by cats. These carry germs that can cause birth defects in the baby and possibly loss of the fetus by miscarriage or stillbirth.  Avoid all smoking, herbs, alcohol, and unprescribed drugs. Chemicals in  these products can affect the formation and growth of the baby.  Do not use any products that contain nicotine or tobacco, such as cigarettes and e-cigarettes. If you need help quitting, ask your health care provider.  Visit your dentist if you have not gone yet during your pregnancy. Use a soft toothbrush to brush your teeth and be gentle when you floss. Contact a health care provider if:  You have dizziness.  You have mild pelvic cramps, pelvic pressure, or nagging pain in the abdominal area.  You have persistent nausea, vomiting, or diarrhea.  You have a bad smelling vaginal discharge.  You have pain when you urinate. Get help right away if:  You have a fever.  You are leaking fluid from your vagina.  You have spotting or bleeding from your vagina.  You have severe abdominal cramping or pain.  You have rapid weight gain or weight loss.  You  have shortness of breath with chest pain.  You notice sudden or extreme swelling of your face, hands, ankles, feet, or legs.  You have not felt your baby move in over an hour.  You have severe headaches that do not go away when you take medicine.  You have vision changes. Summary  The second trimester is from week 14 through week 27 (months 4 through 6). It is also a time when the fetus is growing rapidly.  Your body goes through many changes during pregnancy. The changes vary from woman to woman.  Avoid all smoking, herbs, alcohol, and unprescribed drugs. These chemicals affect the formation and growth your baby.  Do not use any tobacco products, such as cigarettes, chewing tobacco, and e-cigarettes. If you need help quitting, ask your health care provider.  Contact your health care provider if you have any questions. Keep all prenatal visits as told by your health care provider. This is important. This information is not intended to replace advice given to you by your health care provider. Make sure you discuss any questions you have with your health care provider. Document Released: 04/12/2001 Document Revised: 09/24/2015 Document Reviewed: 06/19/2012 Elsevier Interactive Patient Education  2017 [employer]   Preterm Labor and Birth Information The normal length of a pregnancy is 39-41 weeks. Preterm labor is when labor starts before 37 completed weeks of pregnancy. What are the risk factors for preterm labor? Preterm labor is more likely to occur in women who:  Have certain infections during pregnancy such as a bladder infection, sexually transmitted infection, or infection inside the uterus (chorioamnionitis).  Have a shorter-than-normal cervix.  Have gone into preterm labor before.  Have had surgery on their cervix.  Are younger than age 26 or older than age 26.  Are African American.  Are pregnant with twins or multiple babies (multiple gestation).  Take street  drugs or smoke while pregnant.  Do not gain enough weight while pregnant.  Became pregnant shortly after having been pregnant. What are the symptoms of preterm labor? Symptoms of preterm labor include:  Cramps similar to those that can happen during a menstrual period. The cramps may happen with diarrhea.  Pain in the abdomen or lower back.  Regular uterine contractions that may feel like tightening of the abdomen.  A feeling of increased pressure in the pelvis.  Increased watery or bloody mucus discharge from the vagina.  Water breaking (ruptured amniotic sac). Why is it important to recognize signs of preterm labor? It is important to recognize signs of preterm labor because babies who  are born prematurely may not be fully developed. This can put them at an increased risk for:  Long-term (chronic) heart and lung problems.  Difficulty immediately after birth with regulating body systems, including blood sugar, body temperature, heart rate, and breathing rate.  Bleeding in the brain.  Cerebral palsy.  Learning difficulties.  Death. These risks are highest for babies who are born before 34 weeks of pregnancy. How is preterm labor treated? Treatment depends on the length of your pregnancy, your condition, and the health of your baby. It may involve:  Having a stitch (suture) placed in your cervix to prevent your cervix from opening too early (cerclage).  Taking or being given medicines, such as:  Hormone medicines. These may be given early in pregnancy to help support the pregnancy.  Medicine to stop contractions.  Medicines to help mature the babys lungs. These may be prescribed if the risk of delivery is high.  Medicines to prevent your baby from developing cerebral palsy. If the labor happens before 34 weeks of pregnancy, you may need to stay in the hospital. What should I do if I think I am in preterm labor? If you think that you are going into preterm labor, call  your health care provider right away. How can I prevent preterm labor in future pregnancies? To increase your chance of having a full-term pregnancy:  Do not use any tobacco products, such as cigarettes, chewing tobacco, and e-cigarettes. If you need help quitting, ask your health care provider.  Do not use street drugs or medicines that have not been prescribed to you during your pregnancy.  Talk with your health care provider before taking any herbal supplements, even if you have been taking them regularly.  Make sure you gain a healthy amount of weight during your pregnancy.  Watch for infection. If you think that you might have an infection, get it checked right away.  Make sure to tell your health care provider if you have gone into preterm labor before. This information is not intended to replace advice given to you by your health care provider. Make sure you discuss any questions you have with your health care provider. Document Released: 07/09/2003 Document Revised: 09/29/2015 Document Reviewed: 09/09/2015 Elsevier Interactive Patient Education  2017 [employer].

## 2016-06-28 NOTE — MAU Note (Addendum)
Just found out she was preg.  Hadn't had a period since Oct.  Last night she thought she felt something move in her stomach.  +HPT this morning. In 2016 she lost a child- baby was born prematurely, periods have been irreg since.  Hx of 2 PTD.
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
## 2016-06-29 ENCOUNTER — Telehealth: Payer: Self-pay | Admitting: *Deleted

## 2016-06-29 ENCOUNTER — Encounter (HOSPITAL_COMMUNITY): Payer: Self-pay | Admitting: Advanced Practice Midwife

## 2016-06-29 DIAGNOSIS — O09219 Supervision of pregnancy with history of pre-term labor, unspecified trimester: Secondary | ICD-10-CM

## 2016-06-29 DIAGNOSIS — Z3492 Encounter for supervision of normal pregnancy, unspecified, second trimester: Secondary | ICD-10-CM

## 2016-06-29 DIAGNOSIS — O0930 Supervision of pregnancy with insufficient antenatal care, unspecified trimester: Secondary | ICD-10-CM

## 2016-06-29 DIAGNOSIS — O09899 Supervision of other high risk pregnancies, unspecified trimester: Secondary | ICD-10-CM

## 2016-06-29 NOTE — Telephone Encounter (Signed)
[facility] yesterday for pt. She is 21 wks and has Medicaid, therefore she does not meet criteria for the medication. Her case will be closed.

## 2016-06-30 ENCOUNTER — Other Ambulatory Visit (HOSPITAL_COMMUNITY): Payer: Self-pay | Admitting: Advanced Practice Midwife

## 2016-06-30 ENCOUNTER — Encounter (HOSPITAL_COMMUNITY): Payer: Self-pay

## 2016-06-30 ENCOUNTER — Ambulatory Visit (HOSPITAL_COMMUNITY)
Admission: RE | Admit: 2016-06-30 | Discharge: 2016-06-30 | Disposition: A | Discharge status: Home / Self Care | Payer: Medicaid Other | Source: Ambulatory Visit | Attending: Advanced Practice Midwife | Admitting: Advanced Practice Midwife

## 2016-06-30 DIAGNOSIS — O09892 Supervision of other high risk pregnancies, second trimester: Secondary | ICD-10-CM

## 2016-06-30 DIAGNOSIS — O99332 Smoking (tobacco) complicating pregnancy, second trimester: Secondary | ICD-10-CM | POA: Diagnosis not present

## 2016-06-30 DIAGNOSIS — Z3687 Encounter for antenatal screening for uncertain dates: Secondary | ICD-10-CM | POA: Insufficient documentation

## 2016-06-30 DIAGNOSIS — Z3492 Encounter for supervision of normal pregnancy, unspecified, second trimester: Secondary | ICD-10-CM

## 2016-06-30 DIAGNOSIS — O0932 Supervision of pregnancy with insufficient antenatal care, second trimester: Secondary | ICD-10-CM

## 2016-06-30 DIAGNOSIS — Z363 Encounter for antenatal screening for malformations: Secondary | ICD-10-CM | POA: Diagnosis not present

## 2016-06-30 DIAGNOSIS — O09292 Supervision of pregnancy with other poor reproductive or obstetric history, second trimester: Secondary | ICD-10-CM | POA: Insufficient documentation

## 2016-06-30 DIAGNOSIS — Z3A21 21 weeks gestation of pregnancy: Secondary | ICD-10-CM

## 2016-06-30 DIAGNOSIS — O09212 Supervision of pregnancy with history of pre-term labor, second trimester: Secondary | ICD-10-CM | POA: Diagnosis not present

## 2016-06-30 DIAGNOSIS — O99322 Drug use complicating pregnancy, second trimester: Secondary | ICD-10-CM | POA: Diagnosis not present

## 2016-06-30 DIAGNOSIS — O09899 Supervision of other high risk pregnancies, unspecified trimester: Secondary | ICD-10-CM

## 2016-06-30 HISTORY — DX: Encounter for other specified aftercare: Z51.89

## 2016-06-30 HISTORY — DX: Hemorrhage, not elsewhere classified: R58

## 2016-07-01 ENCOUNTER — Other Ambulatory Visit (HOSPITAL_COMMUNITY): Payer: Self-pay | Admitting: *Deleted

## 2016-07-01 DIAGNOSIS — O09219 Supervision of pregnancy with history of pre-term labor, unspecified trimester: Secondary | ICD-10-CM

## 2016-07-05 ENCOUNTER — Telehealth: Payer: Self-pay | Admitting: General Practice

## 2016-07-05 NOTE — Telephone Encounter (Signed)
Per Tracy KinsmanVirginia Tyler, we need to call patient and have her come in to complete new makena paperwork. Patient's dating has changed making her eligible for 17p now. Called and discussed with patient. Patient verbalized understanding & states she will come in tomorrow to complete an application. Patient had no questions

## 2016-07-11 LAB — AFP, QUAD SCREEN
DIA Mom Value: 1.61
DIA Value (EIA): 289.44 pg/mL
DSR (BY AGE) 1 IN: 987
DSR (SECOND TRIMESTER) 1 IN: 7326
Gestational Age: 18.4 WEEKS
MSAFP MOM: 1.05
MSAFP: 53 ng/mL
MSHCG Mom: 0.55
MSHCG: 14710 m[IU]/mL
Maternal Age At EDD: 25.9 YEARS
OSB RISK: 10000
Test Results:: NEGATIVE
UE3 MOM: 1.17
UE3 VALUE: 1.7 ng/mL
Weight: 146 [lb_av]

## 2016-07-11 LAB — PRENATAL PROFILE I(LABCORP)
Antibody Screen: NEGATIVE
BASOS ABS: 0 10*3/uL (ref 0.0–0.2)
Basos: 0 %
EOS (ABSOLUTE): 0.4 10*3/uL (ref 0.0–0.4)
EOS: 4 %
HEMOGLOBIN: 10.4 g/dL — AB (ref 11.1–15.9)
Hematocrit: 31.1 % — ABNORMAL LOW (ref 34.0–46.6)
Hepatitis B Surface Ag: NEGATIVE
IMMATURE GRANULOCYTES: 0 %
Immature Grans (Abs): 0 10*3/uL (ref 0.0–0.1)
LYMPHS ABS: 2.9 10*3/uL (ref 0.7–3.1)
Lymphs: 26 %
MCH: 28.9 pg (ref 26.6–33.0)
MCHC: 33.4 g/dL (ref 31.5–35.7)
MCV: 86 fL (ref 79–97)
MONOS ABS: 0.6 10*3/uL (ref 0.1–0.9)
Monocytes: 5 %
NEUTROS ABS: 7 10*3/uL (ref 1.4–7.0)
NEUTROS PCT: 65 %
PLATELETS: 288 10*3/uL (ref 150–379)
RBC: 3.6 x10E6/uL — AB (ref 3.77–5.28)
RDW: 14 % (ref 12.3–15.4)
RPR Ser Ql: NONREACTIVE
Rh Factor: POSITIVE
Rubella Antibodies, IGG: 2.29 index (ref >0.99)
WBC: 11 10*3/uL — AB (ref 3.4–10.8)

## 2016-07-14 ENCOUNTER — Encounter (HOSPITAL_COMMUNITY): Payer: Self-pay

## 2016-07-14 ENCOUNTER — Ambulatory Visit (HOSPITAL_COMMUNITY)
Admission: RE | Admit: 2016-07-14 | Discharge: 2016-07-14 | Disposition: A | Discharge status: Home / Self Care | Payer: Self-pay | Source: Ambulatory Visit | Attending: Advanced Practice Midwife | Admitting: Advanced Practice Midwife

## 2016-07-20 ENCOUNTER — Ambulatory Visit (INDEPENDENT_AMBULATORY_CARE_PROVIDER_SITE_OTHER): Payer: Medicaid Other | Admitting: Obstetrics and Gynecology

## 2016-07-20 ENCOUNTER — Encounter: Payer: Self-pay | Admitting: Obstetrics and Gynecology

## 2016-07-20 VITALS — BP 94/56 | HR 86 | Wt 149.9 lb

## 2016-07-20 DIAGNOSIS — Z1151 Encounter for screening for human papillomavirus (HPV): Secondary | ICD-10-CM | POA: Diagnosis not present

## 2016-07-20 DIAGNOSIS — O0932 Supervision of pregnancy with insufficient antenatal care, second trimester: Secondary | ICD-10-CM

## 2016-07-20 DIAGNOSIS — Z124 Encounter for screening for malignant neoplasm of cervix: Secondary | ICD-10-CM | POA: Diagnosis not present

## 2016-07-20 DIAGNOSIS — O09219 Supervision of pregnancy with history of pre-term labor, unspecified trimester: Principal | ICD-10-CM

## 2016-07-20 DIAGNOSIS — O09899 Supervision of other high risk pregnancies, unspecified trimester: Secondary | ICD-10-CM

## 2016-07-20 DIAGNOSIS — O09212 Supervision of pregnancy with history of pre-term labor, second trimester: Secondary | ICD-10-CM

## 2016-07-20 MED ORDER — PANTOPRAZOLE SODIUM 20 MG PO TBEC: 20.0000 mg | DELAYED_RELEASE_TABLET | Freq: Every day | ORAL | 3 refills | Status: DC

## 2016-07-20 MED ORDER — PROGESTERONE MICRONIZED 200 MG PO CAPS: ORAL_CAPSULE | ORAL | 3 refills | Status: DC
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
## 2016-07-20 NOTE — Addendum Note (Signed)
Addended by: Catalina AntiguaONSTANT, Echo Propp on: 07/20/2016 01:55 PM   Modules accepted: Orders

## 2016-07-20 NOTE — Patient Instructions (Addendum)
 Second Trimester of Pregnancy The second trimester is from week 14 through week 27 (months 4 through 6). The second trimester is often a time when you feel your best. Your body has adjusted to being pregnant, and you begin to feel better physically. Usually, morning sickness has lessened or quit completely, you may have more energy, and you may have an increase in appetite. The second trimester is also a time when the fetus is growing rapidly. At the end of the sixth month, the fetus is about 9 inches long and weighs about 1 pounds. You will likely begin to feel the baby move (quickening) between 16 and 20 weeks of pregnancy. Body changes during your second trimester Your body continues to go through many changes during your second trimester. The changes vary from woman to woman.  Your weight will continue to increase. You will notice your lower abdomen bulging out.  You may begin to get stretch marks on your hips, abdomen, and breasts.  You may develop headaches that can be relieved by medicines. The medicines should be approved by your health care provider.  You may urinate more often because the fetus is pressing on your bladder.  You may develop or continue to have heartburn as a result of your pregnancy.  You may develop constipation because certain hormones are causing the muscles that push waste through your intestines to slow down.  You may develop hemorrhoids or swollen, bulging veins (varicose veins).  You may have back pain. This is caused by: ? Weight gain. ? Pregnancy hormones that are relaxing the joints in your pelvis. ? A shift in weight and the muscles that support your balance.  Your breasts will continue to grow and they will continue to become tender.  Your gums may bleed and may be sensitive to brushing and flossing.  Dark spots or blotches (chloasma, mask of pregnancy) may develop on your face. This will likely fade after the baby is born.  A dark line from  your belly button to the pubic area (linea nigra) may appear. This will likely fade after the baby is born.  You may have changes in your hair. These can include thickening of your hair, rapid growth, and changes in texture. Some women also have hair loss during or after pregnancy, or hair that feels dry or thin. Your hair will most likely return to normal after your baby is born.  What to expect at prenatal visits During a routine prenatal visit:  You will be weighed to make sure you and the fetus are growing normally.  Your blood pressure will be taken.  Your abdomen will be measured to track your baby's growth.  The fetal heartbeat will be listened to.  Any test results from the previous visit will be discussed.  Your health care provider may ask you:  How you are feeling.  If you are feeling the baby move.  If you have had any abnormal symptoms, such as leaking fluid, bleeding, severe headaches, or abdominal cramping.  If you are using any tobacco products, including cigarettes, chewing tobacco, and electronic cigarettes.  If you have any questions.  Other tests that may be performed during your second trimester include:  Blood tests that check for: ? Low iron levels (anemia). ? High blood sugar that affects pregnant women (gestational diabetes) between 24 and 28 weeks. ? Rh antibodies. This is to check for a protein on red blood cells (Rh factor).  Urine tests to check for infections, diabetes,   or protein in the urine.  An ultrasound to confirm the proper growth and development of the baby.  An amniocentesis to check for possible genetic problems.  Fetal screens for spina bifida and Down syndrome.  HIV (human immunodeficiency virus) testing. Routine prenatal testing includes screening for HIV, unless you choose not to have this test.  Follow these instructions at home: Medicines  Follow your health care provider's instructions regarding medicine use. Specific  medicines may be either safe or unsafe to take during pregnancy.  Take a prenatal vitamin that contains at least 600 micrograms (mcg) of folic acid.  If you develop constipation, try taking a stool softener if your health care provider approves. Eating and drinking  Eat a balanced diet that includes fresh fruits and vegetables, whole grains, good sources of protein such as meat, eggs, or tofu, and low-fat dairy. Your health care provider will help you determine the amount of weight gain that is right for you.  Avoid raw meat and uncooked cheese. These carry germs that can cause birth defects in the baby.  If you have low calcium intake from food, talk to your health care provider about whether you should take a daily calcium supplement.  Limit foods that are high in fat and processed sugars, such as fried and sweet foods.  To prevent constipation: ? Drink enough fluid to keep your urine clear or pale yellow. ? Eat foods that are high in fiber, such as fresh fruits and vegetables, whole grains, and beans. Activity  Exercise only as directed by your health care provider. Most women can continue their usual exercise routine during pregnancy. Try to exercise for 30 minutes at least 5 days a week. Stop exercising if you experience uterine contractions.  Avoid heavy lifting, wear low heel shoes, and practice good posture.  A sexual relationship may be continued unless your health care provider directs you otherwise. Relieving pain and discomfort  Wear a good support bra to prevent discomfort from breast tenderness.  Take warm sitz baths to soothe any pain or discomfort caused by hemorrhoids. Use hemorrhoid cream if your health care provider approves.  Rest with your legs elevated if you have leg cramps or low back pain.  If you develop varicose veins, wear support hose. Elevate your feet for 15 minutes, 3-4 times a day. Limit salt in your diet. Prenatal Care  Write down your questions.  Take them to your prenatal visits.  Keep all your prenatal visits as told by your health care provider. This is important. Safety  Wear your seat belt at all times when driving.  Make a list of emergency phone numbers, including numbers for family, friends, the hospital, and police and fire departments. General instructions  Ask your health care provider for a referral to a local prenatal education class. Begin classes no later than the beginning of month 6 of your pregnancy.  Ask for help if you have counseling or nutritional needs during pregnancy. Your health care provider can offer advice or refer you to specialists for help with various needs.  Do not use hot tubs, steam rooms, or saunas.  Do not douche or use tampons or scented sanitary pads.  Do not cross your legs for long periods of time.  Avoid cat litter boxes and soil used by cats. These carry germs that can cause birth defects in the baby and possibly loss of the fetus by miscarriage or stillbirth.  Avoid all smoking, herbs, alcohol, and unprescribed drugs. Chemicals in these products   in these products can affect the formation and growth of the baby.  Do not use any products that contain nicotine or tobacco, such as cigarettes and e-cigarettes. If you need help quitting, ask your health care provider.  Visit your dentist if you have not gone yet during your pregnancy. Use a soft toothbrush to brush your teeth and be gentle when you floss. Contact a health care provider if:  You have dizziness.  You have mild pelvic cramps, pelvic pressure, or nagging pain in the abdominal area.  You have persistent nausea, vomiting, or diarrhea.  You have a bad smelling vaginal discharge.  You have pain when you urinate. Get help right away if:  You have a fever.  You are leaking fluid from your vagina.  You have spotting or bleeding from your vagina.  You have severe abdominal cramping or pain.  You have rapid weight gain or weight  loss.  You have shortness of breath with chest pain.  You notice sudden or extreme swelling of your face, hands, ankles, feet, or legs.  You have not felt your baby move in over an hour.  You have severe headaches that do not go away when you take medicine.  You have vision changes. Summary  The second trimester is from week 14 through week 27 (months 4 through 6). It is also a time when the fetus is growing rapidly.  Your body goes through many changes during pregnancy. The changes vary from woman to woman.  Avoid all smoking, herbs, alcohol, and unprescribed drugs. These chemicals affect the formation and growth your baby.  Do not use any tobacco products, such as cigarettes, chewing tobacco, and e-cigarettes. If you need help quitting, ask your health care provider.  Contact your health care provider if you have any questions. Keep all prenatal visits as told by your health care provider. This is important. This information is not intended to replace advice given to you by your health care provider. Make sure you discuss any questions you have with your health care provider. Document Released: 04/12/2001 Document Revised: 09/24/2015 Document Reviewed: 06/19/2012 Elsevier Interactive Patient Education  2017 [employer]  Contraception Choices Contraception (birth control) is the use of any methods or devices to prevent pregnancy. Below are some methods to help avoid pregnancy. Hormonal methods  Contraceptive implant. This is a thin, plastic tube containing progesterone hormone. It does not contain estrogen hormone. Your health care provider inserts the tube in the inner part of the upper arm. The tube can remain in place for up to 3 years. After 3 years, the implant must be removed. The implant prevents the ovaries from releasing an egg (ovulation), thickens the cervical mucus to prevent sperm from entering the uterus, and thins the lining of the inside of the  uterus.  Progesterone-only injections. These injections are given every 3 months by your health care provider to prevent pregnancy. This synthetic progesterone hormone stops the ovaries from releasing eggs. It also thickens cervical mucus and changes the uterine lining. This makes it harder for sperm to survive in the uterus.  Birth control pills. These pills contain estrogen and progesterone hormone. They work by preventing the ovaries from releasing eggs (ovulation). They also cause the cervical mucus to thicken, preventing the sperm from entering the uterus. Birth control pills are prescribed by a health care provider.Birth control pills can also be used to treat heavy periods.  Minipill. This type of birth control pill contains only the progesterone hormone. They are  taken every day of each month and must be prescribed by your health care provider.  Birth control patch. The patch contains hormones similar to those in birth control pills. It must be changed once a week and is prescribed by a health care provider.  Vaginal ring. The ring contains hormones similar to those in birth control pills. It is left in the vagina for 3 weeks, removed for 1 week, and then a new one is put back in place. The patient must be comfortable inserting and removing the ring from the vagina.A health care provider's prescription is necessary.  Emergency contraception. Emergency contraceptives prevent pregnancy after unprotected sexual intercourse. This pill can be taken right after sex or up to 5 days after unprotected sex. It is most effective the sooner you take the pills after having sexual intercourse. Most emergency contraceptive pills are available without a prescription. Check with your pharmacist. Do not use emergency contraception as your only form of birth control. Barrier methods  Female condom. This is a thin sheath (latex or rubber) that is worn over the penis during sexual intercourse. It can be used with  spermicide to increase effectiveness.  Female condom. This is a soft, loose-fitting sheath that is put into the vagina before sexual intercourse.  Diaphragm. This is a soft, latex, dome-shaped barrier that must be fitted by a health care provider. It is inserted into the vagina, along with a spermicidal jelly. It is inserted before intercourse. The diaphragm should be left in the vagina for 6 to 8 hours after intercourse.  Cervical cap. This is a round, soft, latex or plastic cup that fits over the cervix and must be fitted by a health care provider. The cap can be left in place for up to 48 hours after intercourse.  Sponge. This is a soft, circular piece of polyurethane foam. The sponge has spermicide in it. It is inserted into the vagina after wetting it and before sexual intercourse.  Spermicides. These are chemicals that kill or block sperm from entering the cervix and uterus. They come in the form of creams, jellies, suppositories, foam, or tablets. They do not require a prescription. They are inserted into the vagina with an applicator before having sexual intercourse. The process must be repeated every time you have sexual intercourse. Intrauterine contraception  Intrauterine device (IUD). This is a T-shaped device that is put in a woman's uterus during a menstrual period to prevent pregnancy. There are 2 types:  Copper IUD. This type of IUD is wrapped in copper wire and is placed inside the uterus. Copper makes the uterus and fallopian tubes produce a fluid that kills sperm. It can stay in place for 10 years.  Hormone IUD. This type of IUD contains the hormone progestin (synthetic progesterone). The hormone thickens the cervical mucus and prevents sperm from entering the uterus, and it also thins the uterine lining to prevent implantation of a fertilized egg. The hormone can weaken or kill the sperm that get into the uterus. It can stay in place for 3-5 years, depending on which type of IUD  is used. Permanent methods of contraception  Female tubal ligation. This is when the woman's fallopian tubes are surgically sealed, tied, or blocked to prevent the egg from traveling to the uterus.  Hysteroscopic sterilization. This involves placing a small coil or insert into each fallopian tube. Your doctor uses a technique called hysteroscopy to do the procedure. The device causes scar tissue to form. This results in permanent blockage  of the fallopian tubes, so the sperm cannot fertilize the egg. It takes about 3 months after the procedure for the tubes to become blocked. You must use another form of birth control for these 3 months.  Female sterilization. This is when the female has the tubes that carry sperm tied off (vasectomy).This blocks sperm from entering the vagina during sexual intercourse. After the procedure, the man can still ejaculate fluid (semen). Natural planning methods  Natural family planning. This is not having sexual intercourse or using a barrier method (condom, diaphragm, cervical cap) on days the woman could become pregnant.  Calendar method. This is keeping track of the length of each menstrual cycle and identifying when you are fertile.  Ovulation method. This is avoiding sexual intercourse during ovulation.  Symptothermal method. This is avoiding sexual intercourse during ovulation, using a thermometer and ovulation symptoms.  Post-ovulation method. This is timing sexual intercourse after you have ovulated. Regardless of which type or method of contraception you choose, it is important that you use condoms to protect against the transmission of sexually transmitted infections (STIs). Talk with your health care provider about which form of contraception is most appropriate for you. This information is not intended to replace advice given to you by your health care provider. Make sure you discuss any questions you have with your health care provider. Document Released:  04/18/2005 Document Revised: 09/24/2015 Document Reviewed: 10/11/2012 Elsevier Interactive Patient Education  2017 [employer].   Breastfeeding Deciding to breastfeed is one of the best choices you can make for you and your baby. A change in hormones during pregnancy causes your breast tissue to grow and increases the number and size of your milk ducts. These hormones also allow proteins, sugars, and fats from your blood supply to make breast milk in your milk-producing glands. Hormones prevent breast milk from being released before your baby is born as well as prompt milk flow after birth. Once breastfeeding has begun, thoughts of your baby, as well as his or her sucking or crying, can stimulate the release of milk from your milk-producing glands. Benefits of breastfeeding For Your Baby  Your first milk (colostrum) helps your baby's digestive system function better.  There are antibodies in your milk that help your baby fight off infections.  Your baby has a lower incidence of asthma, allergies, and sudden infant death syndrome.  The nutrients in breast milk are better for your baby than infant formulas and are designed uniquely for your baby's needs.  Breast milk improves your baby's brain development.  Your baby is less likely to develop other conditions, such as childhood obesity, asthma, or type 2 diabetes mellitus. For You  Breastfeeding helps to create a very special bond between you and your baby.  Breastfeeding is convenient. Breast milk is always available at the correct temperature and costs nothing.  Breastfeeding helps to burn calories and helps you lose the weight gained during pregnancy.  Breastfeeding makes your uterus contract to its prepregnancy size faster and slows bleeding (lochia) after you give birth.  Breastfeeding helps to lower your risk of developing type 2 diabetes mellitus, osteoporosis, and breast or ovarian cancer later in life. Signs that your baby is  hungry Early Signs of Hunger  Increased alertness or activity.  Stretching.  Movement of the head from side to side.  Movement of the head and opening of the mouth when the corner of the mouth or cheek is stroked (rooting).  Increased sucking sounds, smacking lips, cooing, sighing,  or squeaking.  Hand-to-mouth movements.  Increased sucking of fingers or hands. Late Signs of Hunger  Fussing.  Intermittent crying. Extreme Signs of Hunger  Signs of extreme hunger will require calming and consoling before your baby will be able to breastfeed successfully. Do not wait for the following signs of extreme hunger to occur before you initiate breastfeeding:  Restlessness.  A loud, strong cry.  Screaming. Breastfeeding basics  Breastfeeding Initiation  Find a comfortable place to sit or lie down, with your neck and back well supported.  Place a pillow or rolled up blanket under your baby to bring him or her to the level of your breast (if you are seated). Nursing pillows are specially designed to help support your arms and your baby while you breastfeed.  Make sure that your baby's abdomen is facing your abdomen.  Gently massage your breast. With your fingertips, massage from your chest wall toward your nipple in a circular motion. This encourages milk flow. You may need to continue this action during the feeding if your milk flows slowly.  Support your breast with 4 fingers underneath and your thumb above your nipple. Make sure your fingers are well away from your nipple and your baby's mouth.  Stroke your baby's lips gently with your finger or nipple.  When your baby's mouth is open wide enough, quickly bring your baby to your breast, placing your entire nipple and as much of the colored area around your nipple (areola) as possible into your baby's mouth.  More areola should be visible above your baby's upper lip than below the lower lip.  Your baby's tongue should be between  his or her lower gum and your breast.  Ensure that your baby's mouth is correctly positioned around your nipple (latched). Your baby's lips should create a seal on your breast and be turned out (everted).  It is common for your baby to suck about 2-3 minutes in order to start the flow of breast milk. Latching  Teaching your baby how to latch on to your breast properly is very important. An improper latch can cause nipple pain and decreased milk supply for you and poor weight gain in your baby. Also, if your baby is not latched onto your nipple properly, he or she may swallow some air during feeding. This can make your baby fussy. Burping your baby when you switch breasts during the feeding can help to get rid of the air. However, teaching your baby to latch on properly is still the best way to prevent fussiness from swallowing air while breastfeeding. Signs that your baby has successfully latched on to your nipple:  Silent tugging or silent sucking, without causing you pain.  Swallowing heard between every 3-4 sucks.  Muscle movement above and in front of his or her ears while sucking. Signs that your baby has not successfully latched on to nipple:  Sucking sounds or smacking sounds from your baby while breastfeeding.  Nipple pain. If you think your baby has not latched on correctly, slip your finger into the corner of your baby's mouth to break the suction and place it between your baby's gums. Attempt breastfeeding initiation again. Signs of Successful Breastfeeding  Signs from your baby:  A gradual decrease in the number of sucks or complete cessation of sucking.  Falling asleep.  Relaxation of his or her body.  Retention of a small amount of milk in his or her mouth.  Letting go of your breast by himself or herself. Signs  from you:  Breasts that have increased in firmness, weight, and size 1-3 hours after feeding.  Breasts that are softer immediately after  breastfeeding.  Increased milk volume, as well as a change in milk consistency and color by the fifth day of breastfeeding.  Nipples that are not sore, cracked, or bleeding. Signs That Your Pecola Leisure is Getting Enough Milk  Wetting at least 1-2 diapers during the first 24 hours after birth.  Wetting at least 5-6 diapers every 24 hours for the first week after birth. The urine should be clear or pale yellow by 5 days after birth.  Wetting 6-8 diapers every 24 hours as your baby continues to grow and develop.  At least 3 stools in a 24-hour period by age 2 days. The stool should be soft and yellow.  At least 3 stools in a 24-hour period by age 10 days. The stool should be seedy and yellow.  No loss of weight greater than 10% of birth weight during the first 61 days of age.  Average weight gain of 4-7 ounces (113-198 g) per week after age 31 days.  Consistent daily weight gain by age 2 days, without weight loss after the age of 2 weeks. After a feeding, your baby may spit up a small amount. This is common. Breastfeeding frequency and duration Frequent feeding will help you make more milk and can prevent sore nipples and breast engorgement. Breastfeed when you feel the need to reduce the fullness of your breasts or when your baby shows signs of hunger. This is called "breastfeeding on demand." Avoid introducing a pacifier to your baby while you are working to establish breastfeeding (the first 4-6 weeks after your baby is born). After this time you may choose to use a pacifier. Research has shown that pacifier use during the first year of a baby's life decreases the risk of sudden infant death syndrome (SIDS). Allow your baby to feed on each breast as long as he or she wants. Breastfeed until your baby is finished feeding. When your baby unlatches or falls asleep while feeding from the first breast, offer the second breast. Because newborns are often sleepy in the first few weeks of life, you may need  to awaken your baby to get him or her to feed. Breastfeeding times will vary from baby to baby. However, the following rules can serve as a guide to help you ensure that your baby is properly fed:  Newborns (babies 48 weeks of age or younger) may breastfeed every 1-3 hours.  Newborns should not go longer than 3 hours during the day or 5 hours during the night without breastfeeding.  You should breastfeed your baby a minimum of 8 times in a 24-hour period until you begin to introduce solid foods to your baby at around 32 months of age. Breast milk pumping Pumping and storing breast milk allows you to ensure that your baby is exclusively fed your breast milk, even at times when you are unable to breastfeed. This is especially important if you are going back to work while you are still breastfeeding or when you are not able to be present during feedings. Your lactation consultant can give you guidelines on how long it is safe to store breast milk. A breast pump is a machine that allows you to pump milk from your breast into a sterile bottle. The pumped breast milk can then be stored in a refrigerator or freezer. Some breast pumps are operated by hand, while others use  electricity. Ask your lactation consultant which type will work best for you. Breast pumps can be purchased, but some hospitals and breastfeeding support groups lease breast pumps on a monthly basis. A lactation consultant can teach you how to hand express breast milk, if you prefer not to use a pump. Caring for your breasts while you breastfeed Nipples can become dry, cracked, and sore while breastfeeding. The following recommendations can help keep your breasts moisturized and healthy:  Avoid using soap on your nipples.  Wear a supportive bra. Although not required, special nursing bras and tank tops are designed to allow access to your breasts for breastfeeding without taking off your entire bra or top. Avoid wearing underwire-style bras  or extremely tight bras.  Air dry your nipples for 3-33minutes after each feeding.  Use only cotton bra pads to absorb leaked breast milk. Leaking of breast milk between feedings is normal.  Use lanolin on your nipples after breastfeeding. Lanolin helps to maintain your skin's normal moisture barrier. If you use pure lanolin, you do not need to wash it off before feeding your baby again. Pure lanolin is not toxic to your baby. You may also hand express a few drops of breast milk and gently massage that milk into your nipples and allow the milk to air dry. In the first few weeks after giving birth, some women experience extremely full breasts (engorgement). Engorgement can make your breasts feel heavy, warm, and tender to the touch. Engorgement peaks within 3-5 days after you give birth. The following recommendations can help ease engorgement:  Completely empty your breasts while breastfeeding or pumping. You may want to start by applying warm, moist heat (in the shower or with warm water-soaked hand towels) just before feeding or pumping. This increases circulation and helps the milk flow. If your baby does not completely empty your breasts while breastfeeding, pump any extra milk after he or she is finished.  Wear a snug bra (nursing or regular) or tank top for 1-2 days to signal your body to slightly decrease milk production.  Apply ice packs to your breasts, unless this is too uncomfortable for you.  Make sure that your baby is latched on and positioned properly while breastfeeding. If engorgement persists after 48 hours of following these recommendations, contact your health care provider or a Advertising copywriter. Overall health care recommendations while breastfeeding  Eat healthy foods. Alternate between meals and snacks, eating 3 of each per day. Because what you eat affects your breast milk, some of the foods may make your baby more irritable than usual. Avoid eating these foods if you  are sure that they are negatively affecting your baby.  Drink milk, fruit juice, and water to satisfy your thirst (about 10 glasses a day).  Rest often, relax, and continue to take your prenatal vitamins to prevent fatigue, stress, and anemia.  Continue breast self-awareness checks.  Avoid chewing and smoking tobacco. Chemicals from cigarettes that pass into breast milk and exposure to secondhand smoke may harm your baby.  Avoid alcohol and drug use, including marijuana. Some medicines that may be harmful to your baby can pass through breast milk. It is important to ask your health care provider before taking any medicine, including all over-the-counter and prescription medicine as well as vitamin and herbal supplements. It is possible to become pregnant while breastfeeding. If birth control is desired, ask your health care provider about options that will be safe for your baby. Contact a health care provider if:  You feel like you want to stop breastfeeding or have become frustrated with breastfeeding.  You have painful breasts or nipples.  Your nipples are cracked or bleeding.  Your breasts are red, tender, or warm.  You have a swollen area on either breast.  You have a fever or chills.  You have nausea or vomiting.  You have drainage other than breast milk from your nipples.  Your breasts do not become full before feedings by the fifth day after you give birth.  You feel sad and depressed.  Your baby is too sleepy to eat well.  Your baby is having trouble sleeping.  Your baby is wetting less than 3 diapers in a 24-hour period.  Your baby has less than 3 stools in a 24-hour period.  Your baby's skin or the white part of his or her eyes becomes yellow.  Your baby is not gaining weight by 54 days of age. Get help right away if:  Your baby is overly tired (lethargic) and does not want to wake up and feed.  Your baby develops an unexplained fever. This information is  not intended to replace advice given to you by your health care provider. Make sure you discuss any questions you have with your health care provider. Document Released: 04/18/2005 Document Revised: 09/30/2015 Document Reviewed: 10/10/2012 Elsevier Interactive Patient Education  2017 [employer].   Preterm Labor and Birth Information The normal length of a pregnancy is 39-41 weeks. Preterm labor is when labor starts before 37 completed weeks of pregnancy. What are the risk factors for preterm labor? Preterm labor is more likely to occur in women who:  Have certain infections during pregnancy such as a bladder infection, sexually transmitted infection, or infection inside the uterus (chorioamnionitis).  Have a shorter-than-normal cervix.  Have gone into preterm labor before.  Have had surgery on their cervix.  Are younger than age 86 or older than age 61.  Are African American.  Are pregnant with twins or multiple babies (multiple gestation).  Take street drugs or smoke while pregnant.  Do not gain enough weight while pregnant.  Became pregnant shortly after having been pregnant. What are the symptoms of preterm labor? Symptoms of preterm labor include:  Cramps similar to those that can happen during a menstrual period. The cramps may happen with diarrhea.  Pain in the abdomen or lower back.  Regular uterine contractions that may feel like tightening of the abdomen.  A feeling of increased pressure in the pelvis.  Increased watery or bloody mucus discharge from the vagina.  Water breaking (ruptured amniotic sac). Why is it important to recognize signs of preterm labor? It is important to recognize signs of preterm labor because babies who are born prematurely may not be fully developed. This can put them at an increased risk for:  Long-term (chronic) heart and lung problems.  Difficulty immediately after birth with regulating body systems, including blood sugar, body  temperature, heart rate, and breathing rate.  Bleeding in the brain.  Cerebral palsy.  Learning difficulties.  Death. These risks are highest for babies who are born before 34 weeks of pregnancy. How is preterm labor treated? Treatment depends on the length of your pregnancy, your condition, and the health of your baby. It may involve:  Having a stitch (suture) placed in your cervix to prevent your cervix from opening too early (cerclage).  Taking or being given medicines, such as:  Hormone medicines. These may be given early in pregnancy to help support  the pregnancy.  Medicine to stop contractions.  Medicines to help mature the baby's lungs. These may be prescribed if the risk of delivery is high.  Medicines to prevent your baby from developing cerebral palsy. If the labor happens before 34 weeks of pregnancy, you may need to stay in the hospital. What should I do if I think I am in preterm labor? If you think that you are going into preterm labor, call your health care provider right away. How can I prevent preterm labor in future pregnancies? To increase your chance of having a full-term pregnancy:  Do not use any tobacco products, such as cigarettes, chewing tobacco, and e-cigarettes. If you need help quitting, ask your health care provider.  Do not use street drugs or medicines that have not been prescribed to you during your pregnancy.  Talk with your health care provider before taking any herbal supplements, even if you have been taking them regularly.  Make sure you gain a healthy amount of weight during your pregnancy.  Watch for infection. If you think that you might have an infection, get it checked right away.  Make sure to tell your health care provider if you have gone into preterm labor before. This information is not intended to replace advice given to you by your health care provider. Make sure you discuss any questions you have with your health care  provider. Document Released: 07/09/2003 Document Revised: 09/29/2015 Document Reviewed: 09/09/2015 Elsevier Interactive Patient Education  2017 [employer].

## 2016-07-20 NOTE — Progress Notes (Signed)
Pt did not return to complete her makena application. Now is past the gestational age to qualify.

## 2016-07-21 LAB — CYTOLOGY - PAP: Diagnosis: NEGATIVE

## 2016-07-21 LAB — BILE ACIDS, TOTAL: BILE ACIDS TOTAL: 5.3 umol/L (ref 4.7–24.5)

## 2016-07-27 ENCOUNTER — Encounter (HOSPITAL_COMMUNITY): Payer: Self-pay

## 2016-07-28 ENCOUNTER — Other Ambulatory Visit (HOSPITAL_COMMUNITY): Payer: Self-pay | Admitting: Maternal and Fetal Medicine

## 2016-07-28 ENCOUNTER — Encounter (HOSPITAL_COMMUNITY): Payer: Self-pay

## 2016-07-28 ENCOUNTER — Ambulatory Visit (HOSPITAL_COMMUNITY)
Admission: RE | Admit: 2016-07-28 | Discharge: 2016-07-28 | Disposition: A | Discharge status: Home / Self Care | Payer: Medicaid Other | Source: Ambulatory Visit | Attending: Advanced Practice Midwife | Admitting: Advanced Practice Midwife

## 2016-07-28 DIAGNOSIS — O09292 Supervision of pregnancy with other poor reproductive or obstetric history, second trimester: Secondary | ICD-10-CM | POA: Insufficient documentation

## 2016-07-28 DIAGNOSIS — Z3A22 22 weeks gestation of pregnancy: Secondary | ICD-10-CM | POA: Insufficient documentation

## 2016-07-28 DIAGNOSIS — O09212 Supervision of pregnancy with history of pre-term labor, second trimester: Secondary | ICD-10-CM | POA: Insufficient documentation

## 2016-07-28 DIAGNOSIS — O0932 Supervision of pregnancy with insufficient antenatal care, second trimester: Secondary | ICD-10-CM

## 2016-07-28 DIAGNOSIS — O99322 Drug use complicating pregnancy, second trimester: Secondary | ICD-10-CM | POA: Insufficient documentation

## 2016-07-28 DIAGNOSIS — O09899 Supervision of other high risk pregnancies, unspecified trimester: Secondary | ICD-10-CM

## 2016-07-28 DIAGNOSIS — O09219 Supervision of pregnancy with history of pre-term labor, unspecified trimester: Secondary | ICD-10-CM

## 2016-07-28 DIAGNOSIS — Z3492 Encounter for supervision of normal pregnancy, unspecified, second trimester: Secondary | ICD-10-CM

## 2016-07-28 DIAGNOSIS — O99332 Smoking (tobacco) complicating pregnancy, second trimester: Secondary | ICD-10-CM | POA: Diagnosis not present

## 2016-08-11 ENCOUNTER — Encounter: Payer: Self-pay | Admitting: Family Medicine

## 2016-08-11 ENCOUNTER — Telehealth: Payer: Self-pay | Admitting: Family Medicine

## 2016-08-11 ENCOUNTER — Ambulatory Visit (HOSPITAL_COMMUNITY): Admission: RE | Admit: 2016-08-11 | Payer: Self-pay | Source: Ambulatory Visit

## 2016-08-11 NOTE — Telephone Encounter (Signed)
Called patient due to missed appointment. Female answered phone and stated, "she's not around." requested for patient to return call asap.

## 2016-08-16 ENCOUNTER — Ambulatory Visit (INDEPENDENT_AMBULATORY_CARE_PROVIDER_SITE_OTHER): Payer: Medicaid Other | Admitting: Family Medicine

## 2016-08-16 VITALS — BP 108/60 | HR 83 | Wt 153.6 lb

## 2016-08-16 DIAGNOSIS — O0932 Supervision of pregnancy with insufficient antenatal care, second trimester: Secondary | ICD-10-CM | POA: Diagnosis not present

## 2016-08-16 DIAGNOSIS — O09219 Supervision of pregnancy with history of pre-term labor, unspecified trimester: Secondary | ICD-10-CM

## 2016-08-16 DIAGNOSIS — O09212 Supervision of pregnancy with history of pre-term labor, second trimester: Secondary | ICD-10-CM

## 2016-08-16 DIAGNOSIS — O09892 Supervision of other high risk pregnancies, second trimester: Secondary | ICD-10-CM

## 2016-08-16 DIAGNOSIS — O09899 Supervision of other high risk pregnancies, unspecified trimester: Secondary | ICD-10-CM

## 2016-08-16 MED ORDER — PROGESTERONE MICRONIZED 200 MG PO CAPS: ORAL_CAPSULE | ORAL | 3 refills | Status: DC

## 2016-08-16 NOTE — Progress Notes (Signed)
States that she is having tingling in her hands and feet.

## 2016-08-16 NOTE — Patient Instructions (Signed)
Preventing Preterm Birth Preterm birth is when your baby is delivered between 20 weeks and 37 weeks of pregnancy. A full-term pregnancy lasts for at least 37 weeks. Preterm birth can be dangerous for your baby because the last few weeks of pregnancy are an important time for your baby's brain and lungs to grow. Many things can cause a baby to be born early. Sometimes the cause is not known. There are certain factors that make you more likely to experience preterm birth, such as:  Having a previous baby born preterm.  Being pregnant with twins or other multiples.  Having had fertility treatment.  Being overweight or underweight at the start of your pregnancy.  Having any of the following during pregnancy:  An infection, including a urinary tract infection (UTI) or an STI (sexually transmitted infection).  High blood pressure.  Diabetes.  Vaginal bleeding.  Being age 70 or older.  Being age 76 or younger.  Getting pregnant within 6 months of a previous pregnancy.  Suffering extreme stress or physical or emotional abuse during pregnancy.  Standing for long periods of time during pregnancy, such as working at a job that requires standing. What are the risks? The most serious risk of preterm birth is that the baby may not survive. This is more likely to happen if a baby is born before 34 weeks. Other risks and complications of preterm birth may include your baby having:  Breathing problems.  Brain damage that affects movement and coordination (cerebral palsy).  Feeding difficulties.  Vision or hearing problems.  Infections or inflammation of the digestive tract (colitis).  Developmental delays.  Learning disabilities.  Higher risk for diabetes, heart disease, and high blood pressure later in life. What can I do to lower my risk? Medical care  The most important thing you can do to lower your risk for preterm birth is to get routine medical care during pregnancy (prenatal  care). If you have a high risk of preterm birth, you may be referred to a health care provider who specializes in managing high-risk pregnancies (perinatologist). You may be given medicine to help prevent preterm birth. Lifestyle changes  Certain lifestyle changes can also lower your risk of preterm birth:  Wait at least 6 months after a pregnancy to become pregnant again.  Try to plan pregnancy for when you are between 66 and 19 years old.  Get to a healthy weight before getting pregnant. If you are overweight, work with your health care provider to safely lose weight.  Do not use any products that contain nicotine or tobacco, such as cigarettes and e-cigarettes. If you need help quitting, ask your health care provider.  Do not drink alcohol.  Do not use drugs. Where to find support: For more support, consider:  Talking with your health care provider.  Talking with a therapist or substance abuse counselor, if you need help quitting.  Working with a diet and nutrition specialist (dietitian) or a Systems analyst to maintain a healthy weight.  Joining a support group. Where to find more information: Learn more about preventing preterm birth from:  Centers for Disease Control and Prevention: http://curry.org/  March of Dimes: marchofdimes.org/complications/premature-babies.aspx  American Pregnancy Association: americanpregnancy.org/labor-and-birth/premature-labor Contact a health care provider if:  You have any of the following signs of preterm labor before 37 weeks:  A change or increase in vaginal discharge.  Fluid leaking from your vagina.  Pressure or cramps in your lower abdomen.  A backache that does not go away or gets  worse.  Regular tightening (contractions) in your lower abdomen. Summary  Preterm birth means having your baby during weeks 20-37 of pregnancy.  Preterm birth may put your baby at risk for physical  and mental problems.  Getting good prenatal care can help prevent preterm birth.  You can lower your risk of preterm birth by making certain lifestyle changes, such as not smoking and not using alcohol. This information is not intended to replace advice given to you by your health care provider. Make sure you discuss any questions you have with your health care provider. Document Released: 06/02/2015 Document Revised: 12/26/2015 Document Reviewed: 12/26/2015 Elsevier Interactive Patient Education  2017 [employer].   Second Trimester of Pregnancy The second trimester is from week 13 through week 28, month 4 through 6. This is often the time in pregnancy that you feel your best. Often times, morning sickness has lessened or quit. You may have more energy, and you may get hungry more often. Your unborn baby (fetus) is growing rapidly. At the end of the sixth month, he or she is about 9 inches long and weighs about 1 pounds. You will likely feel the baby move (quickening) between 18 and 20 weeks of pregnancy. Follow these instructions at home:  Avoid all smoking, herbs, and alcohol. Avoid drugs not approved by your doctor.  Do not use any tobacco products, including cigarettes, chewing tobacco, and electronic cigarettes. If you need help quitting, ask your doctor. You may get counseling or other support to help you quit.  Only take medicine as told by your doctor. Some medicines are safe and some are not during pregnancy.  Exercise only as told by your doctor. Stop exercising if you start having cramps.  Eat regular, healthy meals.  Wear a good support bra if your breasts are tender.  Do not use hot tubs, steam rooms, or saunas.  Wear your seat belt when driving.  Avoid raw meat, uncooked cheese, and liter boxes and soil used by cats.  Take your prenatal vitamins.  Take 1500-2000 milligrams of calcium daily starting at the 20th week of pregnancy until you deliver your baby.  Try  taking medicine that helps you poop (stool softener) as needed, and if your doctor approves. Eat more fiber by eating fresh fruit, vegetables, and whole grains. Drink enough fluids to keep your pee (urine) clear or pale yellow.  Take warm water baths (sitz baths) to soothe pain or discomfort caused by hemorrhoids. Use hemorrhoid cream if your doctor approves.  If you have puffy, bulging veins (varicose veins), wear support hose. Raise (elevate) your feet for 15 minutes, 3-4 times a day. Limit salt in your diet.  Avoid heavy lifting, wear low heals, and sit up straight.  Rest with your legs raised if you have leg cramps or low back pain.  Visit your dentist if you have not gone during your pregnancy. Use a soft toothbrush to brush your teeth. Be gentle when you floss.  You can have sex (intercourse) unless your doctor tells you not to.  Go to your doctor visits. Get help if:  You feel dizzy.  You have mild cramps or pressure in your lower belly (abdomen).  You have a nagging pain in your belly area.  You continue to feel sick to your stomach (nauseous), throw up (vomit), or have watery poop (diarrhea).  You have bad smelling fluid coming from your vagina.  You have pain with peeing (urination). Get help right away if:  You have a  fever.  You are leaking fluid from your vagina.  You have spotting or bleeding from your vagina.  You have severe belly cramping or pain.  You lose or gain weight rapidly.  You have trouble catching your breath and have chest pain.  You notice sudden or extreme puffiness (swelling) of your face, hands, ankles, feet, or legs.  You have not felt the baby move in over an hour.  You have severe headaches that do not go away with medicine.  You have vision changes. This information is not intended to replace advice given to you by your health care provider. Make sure you discuss any questions you have with your health care provider. Document  Released: 07/13/2009 Document Revised: 09/24/2015 Document Reviewed: 06/19/2012 Elsevier Interactive Patient Education  2017 [employer].

## 2016-08-16 NOTE — Progress Notes (Signed)
      Subjective:  Tracy Tyler is a 26 y.o. [phone] at [redacted]w[redacted]d being seen today for ongoing prenatal care.  She is currently monitored for the following issues for this high-risk pregnancy and has Pregnancy with uncertain dates in second trimester; Insufficient prenatal care; History of preterm delivery, currently pregnant; and Supervision of other high risk pregnancy, antepartum on her problem list.  Patient reports no complaints. No signs/symptoms of preterm labor. Has been taking Prometrium (but taking orally, since on the Rx bottle that's how it was directed).  Discussed to change to vaginally, new Rx sent, with the SIG reflecting this (it had reflected it as well on the last Rx sent). Contractions: Not present. Vag. Bleeding: None.  Movement: Present. Denies leaking of fluid.   The following portions of the patient's history were reviewed and updated as appropriate: allergies, current medications, past family history, past medical history, past social history, past surgical history and problem list. Problem list updated.  Objective:   Vitals:   08/16/16 1439  BP: 108/60  Pulse: 83  Weight: 153 lb 9.6 oz (69.7 kg)    Fetal Status: Fetal Heart Rate (bpm): 154 Fundal Height: 25 cm Movement: Present      General:  Alert, oriented and cooperative. Patient is in no acute distress.  Skin: Skin is warm and dry. No rash noted.   Cardiovascular: Normal heart rate noted  Respiratory: Normal respiratory effort, no problems with respiration noted  Abdomen: Soft, gravid, appropriate for gestational age. Pain/Pressure: Present     Pelvic:  Cervical exam deferred        Extremities: Normal range of motion.  Edema: None  Mental Status: Normal mood and affect. Normal behavior. Normal judgment and thought content.   Urinalysis:      Assessment and Plan:  Pregnancy: [id number] at [redacted]w[redacted]d  1. Supervision of other high risk pregnancy, antepartum - No history of drug use, used to smoke marijuana 1 year  ago, not during pregnancy - Return in 2 weeks for 2 hr GTT - UTD on prenatal labs  2. History of preterm delivery, currently pregnant - Last pregnancy was 23 week delivery Aug 31, 2014), went into labor (deceased) - Also 27 week delivery 2013/08/30), went into labor (alive) - Taking pills orally (on Rx bottle said take ORALLY), new Rx sent to pharmacy (note sent to pharmacy for correct SIG)  3. Insufficient prenatal care in second trimester - Did not know she was pregnant before, started care at 21 weeks.   MOC: IUD MOF: Unsure, attempt breast first Preterm labor symptoms and general obstetric precautions including but not limited to vaginal bleeding, contractions, leaking of fluid and fetal movement were reviewed in detail with the patient. Please refer to After Visit Summary for other counseling recommendations.  Return in about 4 weeks (around 09/13/2016) for Routine OB visit; 2 weeks for 2 hr GTT only in AM.   Jen Mow, DO OB Fellow [facility], [facility]

## 2016-08-24 ENCOUNTER — Other Ambulatory Visit (HOSPITAL_COMMUNITY): Payer: Self-pay | Admitting: Maternal and Fetal Medicine

## 2016-08-24 ENCOUNTER — Encounter (HOSPITAL_COMMUNITY): Payer: Self-pay

## 2016-08-24 ENCOUNTER — Ambulatory Visit (HOSPITAL_COMMUNITY)
Admission: RE | Admit: 2016-08-24 | Discharge: 2016-08-24 | Disposition: A | Discharge status: Home / Self Care | Payer: Medicaid Other | Source: Ambulatory Visit | Attending: Advanced Practice Midwife | Admitting: Advanced Practice Midwife

## 2016-08-24 DIAGNOSIS — Z3A26 26 weeks gestation of pregnancy: Secondary | ICD-10-CM | POA: Insufficient documentation

## 2016-08-24 DIAGNOSIS — O99332 Smoking (tobacco) complicating pregnancy, second trimester: Secondary | ICD-10-CM

## 2016-08-24 DIAGNOSIS — O99322 Drug use complicating pregnancy, second trimester: Secondary | ICD-10-CM | POA: Diagnosis not present

## 2016-08-24 DIAGNOSIS — O09212 Supervision of pregnancy with history of pre-term labor, second trimester: Secondary | ICD-10-CM | POA: Diagnosis present

## 2016-08-24 DIAGNOSIS — O09292 Supervision of pregnancy with other poor reproductive or obstetric history, second trimester: Secondary | ICD-10-CM | POA: Diagnosis not present

## 2016-08-24 DIAGNOSIS — O09219 Supervision of pregnancy with history of pre-term labor, unspecified trimester: Secondary | ICD-10-CM

## 2016-08-25 ENCOUNTER — Other Ambulatory Visit (HOSPITAL_COMMUNITY): Payer: Self-pay | Admitting: *Deleted

## 2016-08-25 DIAGNOSIS — O09219 Supervision of pregnancy with history of pre-term labor, unspecified trimester: Secondary | ICD-10-CM

## 2016-08-25 DIAGNOSIS — O365990 Maternal care for other known or suspected poor fetal growth, unspecified trimester, not applicable or unspecified: Secondary | ICD-10-CM

## 2016-08-30 ENCOUNTER — Other Ambulatory Visit: Payer: Medicaid Other

## 2016-09-01 ENCOUNTER — Encounter (HOSPITAL_COMMUNITY): Payer: Self-pay

## 2016-09-01 ENCOUNTER — Ambulatory Visit (HOSPITAL_COMMUNITY)
Admission: RE | Admit: 2016-09-01 | Discharge: 2016-09-01 | Disposition: A | Discharge status: Home / Self Care | Payer: Medicaid Other | Source: Ambulatory Visit | Attending: Advanced Practice Midwife | Admitting: Advanced Practice Midwife

## 2016-09-14 ENCOUNTER — Encounter: Payer: Self-pay | Admitting: General Practice

## 2016-09-14 ENCOUNTER — Encounter: Payer: Medicaid Other | Admitting: Family Medicine

## 2016-09-14 ENCOUNTER — Ambulatory Visit (HOSPITAL_COMMUNITY): Admission: RE | Admit: 2016-09-14 | Payer: Medicaid Other | Source: Ambulatory Visit

## 2016-09-22 ENCOUNTER — Encounter: Payer: Medicaid Other | Admitting: Obstetrics and Gynecology

## 2016-09-22 ENCOUNTER — Encounter: Payer: Self-pay | Admitting: Obstetrics and Gynecology

## 2016-09-22 ENCOUNTER — Encounter: Payer: Self-pay | Admitting: General Practice

## 2016-09-22 NOTE — Progress Notes (Signed)
Patient did not keep OB appointment for 09/22/2016.  Ozan Maclay, Jr MD [facility]ending [facility])   

## 2016-10-04 ENCOUNTER — Ambulatory Visit (HOSPITAL_COMMUNITY)
Admission: RE | Admit: 2016-10-04 | Payer: Medicaid Other | Source: Ambulatory Visit | Attending: Obstetrics and Gynecology | Admitting: Obstetrics and Gynecology

## 2016-11-21 ENCOUNTER — Encounter (HOSPITAL_COMMUNITY): Payer: Self-pay | Admitting: *Deleted

## 2016-11-21 ENCOUNTER — Inpatient Hospital Stay (HOSPITAL_COMMUNITY): Payer: Medicaid Other | Admitting: Anesthesiology

## 2016-11-21 ENCOUNTER — Inpatient Hospital Stay (HOSPITAL_COMMUNITY)
Admission: AD | Admit: 2016-11-21 | Discharge: 2016-11-23 | DRG: 775 | Disposition: A | Discharge status: Home / Self Care | Payer: Medicaid Other | Source: Ambulatory Visit | Attending: Family Medicine | Admitting: Family Medicine

## 2016-11-21 DIAGNOSIS — Z3493 Encounter for supervision of normal pregnancy, unspecified, third trimester: Secondary | ICD-10-CM | POA: Diagnosis present

## 2016-11-21 DIAGNOSIS — J45909 Unspecified asthma, uncomplicated: Secondary | ICD-10-CM | POA: Diagnosis present

## 2016-11-21 DIAGNOSIS — O9952 Diseases of the respiratory system complicating childbirth: Secondary | ICD-10-CM | POA: Diagnosis present

## 2016-11-21 DIAGNOSIS — Z3492 Encounter for supervision of normal pregnancy, unspecified, second trimester: Secondary | ICD-10-CM

## 2016-11-21 DIAGNOSIS — Z880 Allergy status to penicillin: Secondary | ICD-10-CM | POA: Diagnosis not present

## 2016-11-21 DIAGNOSIS — O09219 Supervision of pregnancy with history of pre-term labor, unspecified trimester: Secondary | ICD-10-CM

## 2016-11-21 DIAGNOSIS — O09899 Supervision of other high risk pregnancies, unspecified trimester: Secondary | ICD-10-CM

## 2016-11-21 DIAGNOSIS — O0933 Supervision of pregnancy with insufficient antenatal care, third trimester: Secondary | ICD-10-CM

## 2016-11-21 DIAGNOSIS — Z87891 Personal history of nicotine dependence: Secondary | ICD-10-CM

## 2016-11-21 DIAGNOSIS — Z3A39 39 weeks gestation of pregnancy: Secondary | ICD-10-CM

## 2016-11-21 LAB — GROUP B STREP BY PCR: Group B strep by PCR: NEGATIVE

## 2016-11-21 LAB — CBC
HEMATOCRIT: 35 % — AB (ref 36.0–46.0)
Hemoglobin: 11.6 g/dL — ABNORMAL LOW (ref 12.0–15.0)
MCH: 28.6 pg (ref 26.0–34.0)
MCHC: 33.1 g/dL (ref 30.0–36.0)
MCV: 86.2 fL (ref 78.0–100.0)
Platelets: 250 10*3/uL (ref 150–400)
RBC: 4.06 MIL/uL (ref 3.87–5.11)
RDW: 14.5 % (ref 11.5–15.5)
WBC: 11.9 10*3/uL — AB (ref 4.0–10.5)

## 2016-11-21 LAB — TYPE AND SCREEN
ABO/RH(D): O POS
Antibody Screen: NEGATIVE

## 2016-11-21 LAB — ABO/RH: ABO/RH(D): O POS

## 2016-11-21 LAB — RAPID HIV SCREEN (HIV 1/2 AB+AG)
HIV 1/2 Antibodies: NONREACTIVE
HIV-1 P24 Antigen - HIV24: NONREACTIVE

## 2016-11-21 MED ORDER — ACETAMINOPHEN 325 MG PO TABS: 650.0000 mg | ORAL_TABLET | ORAL | Status: DC | PRN

## 2016-11-21 MED ORDER — DIPHENHYDRAMINE HCL 25 MG PO CAPS: 25.0000 mg | ORAL_CAPSULE | Freq: Four times a day (QID) | ORAL | Status: DC | PRN

## 2016-11-21 MED ORDER — PHENYLEPHRINE 40 MCG/ML (10ML) SYRINGE FOR IV PUSH (FOR BLOOD PRESSURE SUPPORT): 80.0000 ug | PREFILLED_SYRINGE | INTRAVENOUS | Status: DC | PRN

## 2016-11-21 MED ORDER — BENZOCAINE-MENTHOL 20-0.5 % EX AERO
1.0000 "application " | INHALATION_SPRAY | CUTANEOUS | Status: DC | PRN
  Administered 2016-11-21: 1 via TOPICAL
  Filled 2016-11-21: qty 56

## 2016-11-21 MED ORDER — FENTANYL 2.5 MCG/ML BUPIVACAINE 1/10 % EPIDURAL INFUSION (WH - ANES)
14.0000 mL/h | INTRAMUSCULAR | Status: DC | PRN
  Administered 2016-11-21: 14 mL/h via EPIDURAL
  Filled 2016-11-21: qty 100

## 2016-11-21 MED ORDER — DIPHENHYDRAMINE HCL 50 MG/ML IJ SOLN: 12.5000 mg | INTRAMUSCULAR | Status: DC | PRN

## 2016-11-21 MED ORDER — ONDANSETRON HCL 4 MG/2ML IJ SOLN: 4.0000 mg | INTRAMUSCULAR | Status: DC | PRN

## 2016-11-21 MED ORDER — COCONUT OIL OIL: 1.0000 "application " | TOPICAL_OIL | Status: DC | PRN

## 2016-11-21 MED ORDER — LACTATED RINGERS IV SOLN
INTRAVENOUS | Status: DC
  Administered 2016-11-21 (×2): via INTRAVENOUS

## 2016-11-21 MED ORDER — PHENYLEPHRINE 40 MCG/ML (10ML) SYRINGE FOR IV PUSH (FOR BLOOD PRESSURE SUPPORT)
80.0000 ug | PREFILLED_SYRINGE | INTRAVENOUS | Status: DC | PRN
  Filled 2016-11-21: qty 5

## 2016-11-21 MED ORDER — EPHEDRINE 5 MG/ML INJ
10.0000 mg | INTRAVENOUS | Status: DC | PRN
  Filled 2016-11-21: qty 2

## 2016-11-21 MED ORDER — SIMETHICONE 80 MG PO CHEW: 80.0000 mg | CHEWABLE_TABLET | ORAL | Status: DC | PRN

## 2016-11-21 MED ORDER — WITCH HAZEL-GLYCERIN EX PADS: 1.0000 "application " | MEDICATED_PAD | CUTANEOUS | Status: DC | PRN

## 2016-11-21 MED ORDER — LACTATED RINGERS IV SOLN
500.0000 mL | Freq: Once | INTRAVENOUS | Status: AC
  Administered 2016-11-21: 500 mL via INTRAVENOUS

## 2016-11-21 MED ORDER — LIDOCAINE HCL (PF) 1 % IJ SOLN
INTRAMUSCULAR | Status: DC | PRN
  Administered 2016-11-21 (×2): 5 mL via EPIDURAL

## 2016-11-21 MED ORDER — IBUPROFEN 600 MG PO TABS
600.0000 mg | ORAL_TABLET | Freq: Four times a day (QID) | ORAL | Status: DC
  Administered 2016-11-21 – 2016-11-22 (×6): 600 mg via ORAL
  Filled 2016-11-21 (×7): qty 1

## 2016-11-21 MED ORDER — DIBUCAINE 1 % RE OINT: 1.0000 "application " | TOPICAL_OINTMENT | RECTAL | Status: DC | PRN

## 2016-11-21 MED ORDER — EPHEDRINE 5 MG/ML INJ: 10.0000 mg | INTRAVENOUS | Status: DC | PRN

## 2016-11-21 MED ORDER — SOD CITRATE-CITRIC ACID 500-334 MG/5ML PO SOLN: 30.0000 mL | ORAL | Status: DC | PRN

## 2016-11-21 MED ORDER — ONDANSETRON HCL 4 MG/2ML IJ SOLN
4.0000 mg | Freq: Four times a day (QID) | INTRAMUSCULAR | Status: DC | PRN
  Administered 2016-11-21: 4 mg via INTRAVENOUS
  Filled 2016-11-21: qty 2

## 2016-11-21 MED ORDER — SENNOSIDES-DOCUSATE SODIUM 8.6-50 MG PO TABS
2.0000 | ORAL_TABLET | ORAL | Status: DC
  Administered 2016-11-21 – 2016-11-22 (×2): 2 via ORAL
  Filled 2016-11-21 (×2): qty 2

## 2016-11-21 MED ORDER — TETANUS-DIPHTH-ACELL PERTUSSIS 5-2.5-18.5 LF-MCG/0.5 IM SUSP
0.5000 mL | Freq: Once | INTRAMUSCULAR | Status: DC
Start: 2016-11-22 — End: 2016-11-23

## 2016-11-21 MED ORDER — FENTANYL CITRATE (PF) 100 MCG/2ML IJ SOLN
50.0000 ug | INTRAMUSCULAR | Status: DC | PRN
  Administered 2016-11-21: 50 ug via INTRAVENOUS
  Administered 2016-11-21: 100 ug via INTRAVENOUS
  Filled 2016-11-21 (×2): qty 2

## 2016-11-21 MED ORDER — ONDANSETRON HCL 4 MG PO TABS: 4.0000 mg | ORAL_TABLET | ORAL | Status: DC | PRN

## 2016-11-21 MED ORDER — LACTATED RINGERS IV SOLN: 500.0000 mL | Freq: Once | INTRAVENOUS | Status: DC

## 2016-11-21 MED ORDER — LIDOCAINE HCL (PF) 1 % IJ SOLN
30.0000 mL | INTRAMUSCULAR | Status: DC | PRN
  Filled 2016-11-21: qty 30

## 2016-11-21 MED ORDER — OXYTOCIN 40 UNITS IN LACTATED RINGERS INFUSION - SIMPLE MED
2.5000 [IU]/h | INTRAVENOUS | Status: DC
  Filled 2016-11-21: qty 1000

## 2016-11-21 MED ORDER — ZOLPIDEM TARTRATE 5 MG PO TABS: 5.0000 mg | ORAL_TABLET | Freq: Every evening | ORAL | Status: DC | PRN

## 2016-11-21 MED ORDER — PHENYLEPHRINE 40 MCG/ML (10ML) SYRINGE FOR IV PUSH (FOR BLOOD PRESSURE SUPPORT)
80.0000 ug | PREFILLED_SYRINGE | INTRAVENOUS | Status: DC | PRN
  Filled 2016-11-21: qty 5
  Filled 2016-11-21: qty 10

## 2016-11-21 MED ORDER — OXYTOCIN BOLUS FROM INFUSION
500.0000 mL | Freq: Once | INTRAVENOUS | Status: AC
  Administered 2016-11-21: 500 mL via INTRAVENOUS

## 2016-11-21 MED ORDER — LACTATED RINGERS IV SOLN: 500.0000 mL | INTRAVENOUS | Status: DC | PRN

## 2016-11-21 MED ORDER — PRENATAL MULTIVITAMIN CH
1.0000 | ORAL_TABLET | Freq: Every day | ORAL | Status: DC
  Administered 2016-11-22: 1 via ORAL
  Filled 2016-11-21: qty 1

## 2016-11-21 NOTE — Anesthesia Pain Management Evaluation Note (Signed)
  [facility] Pain Management Visit Note  Patient: Tracy Tyler, 26 y.o., female  "Hello I am a member of the anesthesia team at [facility]. We have an anesthesia team available at all times to provide care throughout the hospital, including epidural management and anesthesia for C-section. I don't know your plan for the delivery whether it a natural birth, water birth, IV sedation, nitrous supplementation, doula or epidural, but we want to meet your pain goals."   1.Was your pain managed to your expectations on prior hospitalizations?   Yes   2.What is your expectation for pain management during this hospitalization?     Epidural  3.How can we help you reach that goal? ANMD at bedside to place epidural  Record the patient's initial score and the patient's pain goal.   Pain: 10  Pain Goal: 5 [facility] wants you to be able to say your pain was always managed very well.  Tracy Tyler 11/21/2016

## 2016-11-21 NOTE — Anesthesia Preprocedure Evaluation (Signed)
Anesthesia Evaluation  Patient identified by MRN, date of birth, ID band Patient awake    Reviewed: Allergy & Precautions, H&P , NPO status , Patient's Chart, lab work & pertinent test results  Airway Mallampati: II   Neck ROM: full    Dental   Pulmonary asthma , former smoker,    breath sounds clear to auscultation       Cardiovascular negative cardio ROS   Rhythm:regular Rate:Normal     Neuro/Psych PSYCHIATRIC DISORDERS Anxiety Depression    GI/Hepatic   Endo/Other    Renal/GU      Musculoskeletal   Abdominal   Peds  Hematology   Anesthesia Other Findings   Reproductive/Obstetrics (+) Pregnancy                             Anesthesia Physical Anesthesia Plan  ASA: II  Anesthesia Plan: Epidural   Post-op Pain Management:    Induction: Intravenous  PONV Risk Score and Plan: 2 and Treatment may vary due to age or medical condition  Airway Management Planned: Natural Airway  Additional Equipment:   Intra-op Plan:   Post-operative Plan:   Informed Consent: I have reviewed the patients History and Physical, chart, labs and discussed the procedure including the risks, benefits and alternatives for the proposed anesthesia with the patient or authorized representative who has indicated his/her understanding and acceptance.     Plan Discussed with: Anesthesiologist  Anesthesia Plan Comments:         Anesthesia Quick Evaluation
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
## 2016-11-21 NOTE — MAU Note (Signed)
Contractions started during the night, now every 2 in.  "but hurts". No bleeding or leaking

## 2016-11-21 NOTE — Anesthesia Procedure Notes (Signed)
Epidural Patient location during procedure: OB Start time: 11/21/2016 1:35 PM End time: 11/21/2016 1:47 PM  Staffing Anesthesiologist: Chaney MallingHODIERNE, Kaysa Roulhac Performed: anesthesiologist   Preanesthetic Checklist Completed: patient identified, site marked, pre-op evaluation, timeout performed, IV checked, risks and benefits discussed and monitors and equipment checked  Epidural Patient position: sitting Prep: DuraPrep Patient monitoring: heart rate, cardiac monitor, continuous pulse ox and blood pressure Approach: midline Location: L2-L3 Injection technique: LOR saline  Needle:  Needle type: Tuohy  Needle gauge: 17 G Needle length: 9 cm Needle insertion depth: 5 cm Catheter type: closed end flexible Catheter size: 19 Gauge Catheter at skin depth: 11 cm Test dose: negative and Other  Assessment Events: blood not aspirated, injection not painful, no injection resistance and negative IV test  Additional Notes Informed consent obtained prior to proceeding including risk of failure, 1% risk of PDPH, risk of minor discomfort and bruising.  Discussed rare but serious complications including epidural abscess, permanent nerve injury, epidural hematoma.  Discussed alternatives to epidural analgesia and patient desires to proceed.  Timeout performed pre-procedure verifying patient name, procedure, and platelet count.  Patient tolerated procedure well. Reason for block:procedure for pain

## 2016-11-21 NOTE — MAU Note (Signed)
Pt transferred via stretcher to L&D, at 1051, upon arrival srom of meconium stained fluid.  Resident called

## 2016-11-21 NOTE — Anesthesia Postprocedure Evaluation (Signed)
Anesthesia Post Note  Patient: Tracy Tyler  Procedure(s) Performed: * No procedures listed *     Patient location during evaluation: Mother Baby Anesthesia Type: Epidural Level of consciousness: awake and alert Pain management: satisfactory to patient Vital Signs Assessment: post-procedure vital signs reviewed and stable Respiratory status: respiratory function stable Cardiovascular status: stable Postop Assessment: no headache, no backache, epidural receding, patient able to bend at knees, no signs of nausea or vomiting and adequate PO intake Anesthetic complications: no    Last Vitals:  Vitals:   11/21/16 1715 11/21/16 1815  BP: 127/77 118/64  Pulse: 70 67  Resp: 16 18  Temp: 36.4 C 36.8 C    Last Pain:  Vitals:   11/21/16 1815  TempSrc: Oral  PainSc:    Pain Goal: Patients Stated Pain Goal: 10 (11/21/16 1059)               Quron Ruddy

## 2016-11-22 ENCOUNTER — Encounter (HOSPITAL_COMMUNITY): Payer: Self-pay

## 2016-11-22 LAB — GC/CHLAMYDIA PROBE AMP (~~LOC~~) NOT AT ARMC
CHLAMYDIA, DNA PROBE: NEGATIVE
NEISSERIA GONORRHEA: NEGATIVE

## 2016-11-22 LAB — RPR: RPR: NONREACTIVE

## 2016-11-22 NOTE — Lactation Note (Signed)
This note was copied from a baby's chart. Lactation Consultation Note  Patient Name: Tracy Tyler NWGNF'AToday's Date: 11/22/2016 Reason for consult: Initial assessment Baby at 20 hr of life. Mom desires to offer formula bottles and latch baby. She stated, "I bf all my babies, the last one was til he was 4316m old". Mom denies breast or nipple pain, voiced no concerns. Discussed baby behavior, feeding frequency, baby belly size, supplementing, voids, wt loss, breast changes, and nipple care. Mom stated she can manually express and a spoon at the bedside. She stated, "I ain't doing that, I will give him a bottle". Given lactation handouts. Aware of OP services and support group.     Maternal Data    Feeding Feeding Type: Bottle Fed - Formula  LATCH Score/Interventions                      Lactation Tools Discussed/Used     Consult Status Consult Status: Follow-up Date: 11/23/16 Follow-up type: In-patient    Rulon Eisenmengerlizabeth E Amil Bouwman 11/22/2016, 11:56 AM

## 2016-11-22 NOTE — Progress Notes (Signed)
Post Partum Day #1 Subjective: no complaints, up ad lib and tolerating PO; breast and bottlefeeding; considering Nexplanon for pp contraception  Objective: Blood pressure (!) 106/56, pulse 71, temperature 98.2 F (36.8 C), temperature source Oral, resp. rate 18, height 5\' 6"  (1.676 m), weight 74.8 kg (165 lb), last menstrual period 02/02/2016, SpO2 100 %, unknown if currently breastfeeding.  Physical Exam:  General: alert, cooperative and no distress Lochia: appropriate Uterine Fundus: firm DVT Evaluation: No evidence of DVT seen on physical exam.   Recent Labs  11/21/16 1102  HGB 11.6*  HCT 35.0*    Assessment/Plan: Plan for discharge tomorrow   LOS: 1 day   SHAW, KIMBERLY CNM 11/22/2016, 8:16 AM

## 2016-11-22 NOTE — Progress Notes (Signed)
CSW acknowledges consult.  CSW attempted to meet with MOB, however MOB was asleep when CSW arrived.  CSW will attempt to visit with MOB at a later time.   Blaine HamperAngel Boyd-Gilyard, MSW, LCSW Clinical Social Work [phone]

## 2016-11-23 MED ORDER — IBUPROFEN 600 MG PO TABS: 600.0000 mg | ORAL_TABLET | Freq: Four times a day (QID) | ORAL | 0 refills | Status: DC | PRN

## 2016-11-23 NOTE — Discharge Summary (Signed)
OB Discharge Summary     Patient Name: Tracy GinsbergRasheda Ridling DOB: May 27, 1990 MRN: [id number]  Date of admission: 11/21/2016 Delivering MD: Frederik PearEGELE, JULIE P   Date of discharge: 11/23/2016  Admitting diagnosis: LABOR Intrauterine pregnancy: 9037w2d     Secondary diagnosis:  Principal Problem:   Normal labor SROM  Additional problems: prev PTD x 2; onset prenatal care @ 21wks     Discharge diagnosis: Term Pregnancy Delivered                                                                                                Post partum procedures:none  Augmentation: none  Complications: None  Hospital course:  Onset of Labor With Vaginal Delivery     26 y.o. yo [id number] at 3937w2d was admitted in Active Labor on 11/21/2016. Patient had an uncomplicated labor course as follows:  Membrane Rupture Time/Date: 10:51 AM ,11/21/2016   Intrapartum Procedures: Episiotomy: None [1]                                         Lacerations:  None [1]  Patient had a delivery of a Viable infant. 11/21/2016  Information for the patient's newborn:  Tracy Tyler, Boy Tracy Tyler [[id number]][[id number]]  Delivery Method: Vaginal, Spontaneous Delivery (Filed from Delivery Summary)    Pateint had an uncomplicated postpartum course.  She is ambulating, tolerating a regular diet, passing flatus, and urinating well. Patient is discharged home in stable condition on 11/23/16. SW consult pending.   Physical exam  Vitals:   11/21/16 2145 11/22/16 0530 11/22/16 1839 11/23/16 0525  BP: (!) 125/57 (!) 106/56 118/76 118/64  Pulse: 68 71 68 77  Resp: 18 18 18 18   Temp: 97.7 F (36.5 C) 98.2 F (36.8 C) 98.5 F (36.9 C) 97.9 F (36.6 C)  TempSrc: Axillary Oral Oral Oral  SpO2: 100% 100%    Weight:      Height:       General: alert and cooperative Lochia: appropriate Uterine Fundus: firm Incision: N/A DVT Evaluation: No evidence of DVT seen on physical exam. Labs: Lab Results  Component Value Date   WBC 11.9 (H) 11/21/2016   HGB 11.6  (L) 11/21/2016   HCT 35.0 (L) 11/21/2016   MCV 86.2 11/21/2016   PLT 250 11/21/2016   CMP Latest Ref Rng & Units 02/09/2015  Glucose 65 - 99 mg/dL 130(Q102(H)  BUN 6 - 20 mg/dL 8  Creatinine 6.570.44 - 8.461.00 mg/dL 9.620.83  Sodium 952135 - 841145 mmol/L 140  Potassium 3.5 - 5.1 mmol/L 3.9  Chloride 101 - 111 mmol/L 105  CO2 22 - 32 mmol/L 28  Calcium 8.9 - 10.3 mg/dL 9.6  Total Protein 6.5 - 8.1 g/dL 7.7  Total Bilirubin 0.3 - 1.2 mg/dL 0.5  Alkaline Phos 38 - 126 U/L 57  AST 15 - 41 U/L 23  ALT 14 - 54 U/L 17    Discharge instruction: per After Visit Summary and "Baby and Me Booklet".  After visit meds:  Allergies as of 11/23/2016  Reactions   Penicillins Hives   Has patient had a PCN reaction causing immediate rash, facial/tongue/throat swelling, SOB or lightheadedness with hypotension: Yes Has patient had a PCN reaction causing severe rash involving mucus membranes or skin necrosis: Yes Has patient had a PCN reaction that required hospitalization: No Has patient had a PCN reaction occurring within the last 10 years: No If all of the above answers are "NO", then may proceed with Cephalosporin use.      Medication List    TAKE these medications   albuterol 108 (90 Base) MCG/ACT inhaler Commonly known as:  PROVENTIL HFA;VENTOLIN HFA Inhale 1 puff into the lungs as needed.   CONCEPT OB 130-92.4-1 MG Caps Take 1 tablet by mouth daily.   ibuprofen 600 MG tablet Commonly known as:  ADVIL,MOTRIN Take 1 tablet (600 mg total) by mouth every 6 (six) hours as needed.       Diet: routine diet  Activity: Advance as tolerated. Pelvic rest for 6 weeks.   Outpatient follow up:4 weeks Follow up Appt:No future appointments. Follow up Visit:No Follow-up on file.  Postpartum contraception: Nexplanon  Newborn Data: Live born female  Birth Weight: 6 lb 4 oz (2835 g) APGAR: 9, 9  Baby Feeding: Bottle and Breast Disposition:home with mother   11/23/2016 Cam HaiSHAW, KIMBERLY, CNM  8:30  AM

## 2016-11-23 NOTE — Clinical Social Work Maternal (Signed)
CLINICAL SOCIAL WORK MATERNAL/CHILD NOTE  Patient Details  Name: Tracy Tyler MRN: [id number] Date of Birth: 05/15/1990  Date:  11/23/2016  Clinical Social Worker Initiating Note:  Mael Delap Tyler Date/ Time Initiated:  11/23/16/1042     Child's Name:  Tracy Tyler   Legal Guardian:  Mother (FOB is Tracy Tyler 09/10/1980)   Need for Interpreter:  None   Date of Referral:  11/23/16     Reason for Referral:  Behavioral Health Issues, including SI , Late or No Prenatal Care  (hx of depression and Limited PNC  (2 OB visits).)   Referral Source:  Central Nursery   Address:  [address] [place] [address]  Phone number:  [phone]   Household Members:  Self, Parents, Minor Children (MOB older children are Tracy Tyler 6/26/6, Tracy Tyler 03/08/13, and Tracy Tyler 01/10/14)   Natural Supports (not living in the home):  Spouse/significant other (FOB's Mother will also be available to provide support to the family. )   Professional Supports: None   Employment: Unemployed   Type of Work:     Education:  9 to 11 years   Financial Resources:  Medicaid   Other Resources:  WIC, Food Stamps    Cultural/Religious Considerations Which May Impact Care:  None Reported  Strengths:  Ability to meet basic needs , Pediatrician chosen , Understanding of illness, Home prepared for child    Risk Factors/Current Problems:  Mental Health Concerns    Cognitive State:  Linear Thinking , Insightful    Mood/Affect:  Happy , Bright , Calm , Interested , Comfortable    CSW Assessment: CSW met with MOB to complete an assessment for late PNC and hx of depression.  When CSW arrived, MOB was asleep with infant in bed.  CSW educated MOB about SIDS and informed MOB that if staff observe MOB sleeping with infant in bed again, CSW will make a report to [place] CPS; MOB was understanding. Throughout the assessment, MOB was attaching and bonding with infant as  evident by engaging in breastfeeding and skin to skin.  MOB was polite, easy to engage, and receptive to meeting with CSW.  MOB was appropriate with infant during assessment and responded appropriately to infant's cues. CSW explained CSW's role an encouraged MOB to ask questions. CSW inquired about MOB's PNC.  MOB reported that MOB was not aware that MOB was pregnant until after [redacted] weeks gestational.  MOB communicated that once pregnancy was confirmed MOB was back and forth from [place] to [place] visiting with family members. CSW explained the hospital's policy regarding  Limited PNC; MOB was understanding. MOB denied the use of illicit substance and expressed the MOB was not concerned about infants screens. CSW made MOB aware that infant's UDS was negative and CSW will continue to monitor infant's CDS.  CSW informed MOB that if infant's CDS is positive without an explanation, CSW will make a report to [place] CPS. MOB communicated MOB is prepared for the infant and has all needed items.   CSW inquired about MOB's MH hx and MOB acknowledged that being diagnosed at age 13 for depression.  MOB reports MOB has not experience any signs or symptoms since age 15.  MOB also denied PPD with MOB's older children. CSW educated MOB about PPD and informed MOB of possible supports and interventions to decrease PPD.  CSW also encouraged MOB to seek medical attention if  MOB did not have any questions or concerns at this time,   and CSW thanked MOB for allowing CSW to meet with her.  CSW Plan/Description:  Information/Referral to Community Resources , Patient/Family Education , No Further Intervention Required/No Barriers to Discharge (CSW was monitor infant's CDS and will make a report to [place] CPS if warranted. )   Tracy Tyler, MSW, LCSW Clinical Social Work [phone]  Tracy Fread D BOYD-GILYARD, LCSW 11/23/2016, 10:47 AM 

## 2016-11-23 NOTE — Discharge Instructions (Signed)

## 2016-11-23 NOTE — Lactation Note (Signed)
This note was copied from a baby's chart. Lactation Consultation Note  Patient Name: Boy Carmon GinsbergRasheda Laboy IHKVQ'QToday's Date: 11/23/2016 Reason for consult: Follow-up assessment Baby at 44 hr of life and dyad set for d/c today. Mom is reporting bilateral sore nipples. The L nipple appears normal. The R nipple has a pea sized raised bright red blister in the center of the nipple surface. Given comfort gels and Harmony for home use. Discussed bf positioning to get a deep latch, baby behavior, feeding frequency, artificial nipples, pumping, baby belly size, voids, wt loss, breast changes, and nipple care. Mom is aware of lactation services and support group. She will call as needed.    Maternal Data    Feeding    LATCH Score/Interventions                      Lactation Tools Discussed/Used     Consult Status Consult Status: Complete    Rulon Eisenmengerlizabeth E Marcelo Ickes 11/23/2016, 11:35 AM

## 2016-12-09 ENCOUNTER — Encounter: Payer: Self-pay | Admitting: General Practice

## 2017-01-04 ENCOUNTER — Ambulatory Visit: Payer: Medicaid Other | Admitting: Medical

## 2017-05-02 NOTE — L&D Delivery Note (Addendum)
Delivery Note At 12:03 PM a viable female  was delivered via Vaginal, Spontaneous  loose nuchal cord x1 reduced (Presentation:ROA  APGAR: 9, 9; weight 5 lb 7.8 oz (2489 g).   Placenta status:complete ,3 vessel .  Cord:  with the following complications: .  Cord pH: Pending Weights 2489 g   Anesthesia:  None  Episiotomy: None Lacerations:  none Suture Repair: NA Est. Blood Loss (mL): 200ml    Mom to postpartum.  Baby to Couplet care / Skin to Skin.  Sandi Ravelingnson B Alvis 11/27/2017, 12:23 PM  I confirm that I have verified the information documented in the resident's note and that I have also personally reperformed the physical exam and all medical decision making activities.  I was gloved and present for entire delivery SVD without incident No difficulty with shoulders No lacerations except for abrasion on labia Infant appears to be 35-36 weeks.  NICU team agrees.  Baby was vigorous and exam reassuring  Aviva SignsMarie L Ferris Tally, CNM

## 2017-09-05 ENCOUNTER — Encounter: Payer: Self-pay | Admitting: *Deleted

## 2017-11-27 ENCOUNTER — Inpatient Hospital Stay (HOSPITAL_COMMUNITY)
Admission: AD | Admit: 2017-11-27 | Discharge: 2017-11-29 | DRG: 805 | Disposition: A | Discharge status: Home / Self Care | Payer: Medicaid Other | Attending: Obstetrics and Gynecology | Admitting: Obstetrics and Gynecology

## 2017-11-27 ENCOUNTER — Other Ambulatory Visit: Payer: Self-pay

## 2017-11-27 ENCOUNTER — Encounter (HOSPITAL_COMMUNITY): Payer: Self-pay | Admitting: *Deleted

## 2017-11-27 DIAGNOSIS — Z880 Allergy status to penicillin: Secondary | ICD-10-CM | POA: Diagnosis not present

## 2017-11-27 DIAGNOSIS — Z3A36 36 weeks gestation of pregnancy: Secondary | ICD-10-CM | POA: Diagnosis not present

## 2017-11-27 DIAGNOSIS — J45909 Unspecified asthma, uncomplicated: Secondary | ICD-10-CM | POA: Diagnosis present

## 2017-11-27 DIAGNOSIS — O9952 Diseases of the respiratory system complicating childbirth: Secondary | ICD-10-CM | POA: Diagnosis present

## 2017-11-27 DIAGNOSIS — Z87891 Personal history of nicotine dependence: Secondary | ICD-10-CM

## 2017-11-27 DIAGNOSIS — O0933 Supervision of pregnancy with insufficient antenatal care, third trimester: Secondary | ICD-10-CM

## 2017-11-27 DIAGNOSIS — Z3483 Encounter for supervision of other normal pregnancy, third trimester: Secondary | ICD-10-CM | POA: Diagnosis present

## 2017-11-27 LAB — URINALYSIS, ROUTINE W REFLEX MICROSCOPIC
Bilirubin Urine: NEGATIVE
GLUCOSE, UA: NEGATIVE mg/dL
Ketones, ur: NEGATIVE mg/dL
NITRITE: NEGATIVE
PH: 6 (ref 5.0–8.0)
PROTEIN: 30 mg/dL — AB
SPECIFIC GRAVITY, URINE: 1.01 (ref 1.005–1.030)
WBC, UA: >50 WBC/hpf — ABNORMAL HIGH (ref 0–5)

## 2017-11-27 LAB — CBC
HCT: 32 % — ABNORMAL LOW (ref 36.0–46.0)
HEMOGLOBIN: 10.8 g/dL — AB (ref 12.0–15.0)
MCH: 30 pg (ref 26.0–34.0)
MCHC: 33.8 g/dL (ref 30.0–36.0)
MCV: 88.9 fL (ref 78.0–100.0)
PLATELETS: 243 10*3/uL (ref 150–400)
RBC: 3.6 MIL/uL — AB (ref 3.87–5.11)
RDW: 14.5 % (ref 11.5–15.5)
WBC: 11.9 10*3/uL — ABNORMAL HIGH (ref 4.0–10.5)

## 2017-11-27 LAB — TYPE AND SCREEN
ABO/RH(D): O POS
ANTIBODY SCREEN: NEGATIVE

## 2017-11-27 LAB — WET PREP, GENITAL
CLUE CELLS WET PREP: NONE SEEN
SPERM: NONE SEEN
Trich, Wet Prep: NONE SEEN
Yeast Wet Prep HPF POC: NONE SEEN

## 2017-11-27 LAB — RAPID URINE DRUG SCREEN, HOSP PERFORMED
Amphetamines: NOT DETECTED
BENZODIAZEPINES: NOT DETECTED
Barbiturates: NOT DETECTED
COCAINE: NOT DETECTED
Opiates: NOT DETECTED
Tetrahydrocannabinol: NOT DETECTED

## 2017-11-27 MED ORDER — SIMETHICONE 80 MG PO CHEW: 80.0000 mg | CHEWABLE_TABLET | ORAL | Status: DC | PRN

## 2017-11-27 MED ORDER — ONDANSETRON HCL 4 MG/2ML IJ SOLN: 4.0000 mg | INTRAMUSCULAR | Status: DC | PRN

## 2017-11-27 MED ORDER — ONDANSETRON HCL 4 MG PO TABS: 4.0000 mg | ORAL_TABLET | ORAL | Status: DC | PRN

## 2017-11-27 MED ORDER — OXYTOCIN 40 UNITS IN LACTATED RINGERS INFUSION - SIMPLE MED: 2.5000 [IU]/h | INTRAVENOUS | Status: DC

## 2017-11-27 MED ORDER — BENZOCAINE-MENTHOL 20-0.5 % EX AERO
1.0000 "application " | INHALATION_SPRAY | CUTANEOUS | Status: DC | PRN
  Administered 2017-11-27: 1 via TOPICAL
  Filled 2017-11-27 (×2): qty 56

## 2017-11-27 MED ORDER — LACTATED RINGERS IV SOLN: 500.0000 mL | INTRAVENOUS | Status: DC | PRN

## 2017-11-27 MED ORDER — ACETAMINOPHEN 325 MG PO TABS: 650.0000 mg | ORAL_TABLET | ORAL | Status: DC | PRN

## 2017-11-27 MED ORDER — IBUPROFEN 600 MG PO TABS
600.0000 mg | ORAL_TABLET | Freq: Four times a day (QID) | ORAL | Status: DC
  Administered 2017-11-27 – 2017-11-29 (×9): 600 mg via ORAL
  Filled 2017-11-27 (×9): qty 1

## 2017-11-27 MED ORDER — MISOPROSTOL 200 MCG PO TABS
1000.0000 ug | ORAL_TABLET | Freq: Once | ORAL | Status: DC
  Filled 2017-11-27: qty 5

## 2017-11-27 MED ORDER — OXYCODONE-ACETAMINOPHEN 5-325 MG PO TABS: 1.0000 | ORAL_TABLET | ORAL | Status: DC | PRN

## 2017-11-27 MED ORDER — TETANUS-DIPHTH-ACELL PERTUSSIS 5-2.5-18.5 LF-MCG/0.5 IM SUSP
0.5000 mL | Freq: Once | INTRAMUSCULAR | Status: AC
  Administered 2017-11-28: 0.5 mL via INTRAMUSCULAR
  Filled 2017-11-27 (×2): qty 0.5

## 2017-11-27 MED ORDER — ZOLPIDEM TARTRATE 5 MG PO TABS: 5.0000 mg | ORAL_TABLET | Freq: Every evening | ORAL | Status: DC | PRN

## 2017-11-27 MED ORDER — PRENATAL MULTIVITAMIN CH
1.0000 | ORAL_TABLET | Freq: Every day | ORAL | Status: DC
  Administered 2017-11-28 – 2017-11-29 (×2): 1 via ORAL
  Filled 2017-11-27 (×2): qty 1

## 2017-11-27 MED ORDER — WITCH HAZEL-GLYCERIN EX PADS: 1.0000 "application " | MEDICATED_PAD | CUTANEOUS | Status: DC | PRN

## 2017-11-27 MED ORDER — OXYCODONE-ACETAMINOPHEN 5-325 MG PO TABS
2.0000 | ORAL_TABLET | ORAL | Status: DC | PRN
Start: 2017-11-27 — End: 2017-11-27

## 2017-11-27 MED ORDER — SOD CITRATE-CITRIC ACID 500-334 MG/5ML PO SOLN: 30.0000 mL | ORAL | Status: DC | PRN

## 2017-11-27 MED ORDER — OXYTOCIN BOLUS FROM INFUSION: 500.0000 mL | Freq: Once | INTRAVENOUS | Status: DC

## 2017-11-27 MED ORDER — LACTATED RINGERS IV SOLN
INTRAVENOUS | Status: DC
  Administered 2017-11-27: 11:00:00 via INTRAVENOUS

## 2017-11-27 MED ORDER — LIDOCAINE HCL (PF) 1 % IJ SOLN
30.0000 mL | INTRAMUSCULAR | Status: DC | PRN
  Filled 2017-11-27: qty 30

## 2017-11-27 MED ORDER — SENNOSIDES-DOCUSATE SODIUM 8.6-50 MG PO TABS
2.0000 | ORAL_TABLET | ORAL | Status: DC
  Administered 2017-11-27 – 2017-11-28 (×2): 2 via ORAL
  Filled 2017-11-27 (×2): qty 2

## 2017-11-27 MED ORDER — ONDANSETRON HCL 4 MG/2ML IJ SOLN: 4.0000 mg | Freq: Four times a day (QID) | INTRAMUSCULAR | Status: DC | PRN

## 2017-11-27 MED ORDER — DIBUCAINE 1 % RE OINT
1.0000 "application " | TOPICAL_OINTMENT | RECTAL | Status: DC | PRN
  Filled 2017-11-27: qty 28

## 2017-11-27 MED ORDER — COCONUT OIL OIL
1.0000 "application " | TOPICAL_OIL | Status: DC | PRN
  Filled 2017-11-27: qty 120

## 2017-11-27 MED ORDER — LACTATED RINGERS IV SOLN
INTRAVENOUS | Status: DC
  Administered 2017-11-27: 12:00:00 via INTRAVENOUS

## 2017-11-27 MED ORDER — GI COCKTAIL ~~LOC~~
30.0000 mL | Freq: Once | ORAL | Status: AC
  Administered 2017-11-27: 30 mL via ORAL
  Filled 2017-11-27: qty 30

## 2017-11-27 MED ORDER — DIPHENHYDRAMINE HCL 25 MG PO CAPS: 25.0000 mg | ORAL_CAPSULE | Freq: Four times a day (QID) | ORAL | Status: DC | PRN
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
## 2017-11-27 NOTE — MAU Note (Signed)
Pt up to BR, bloody/clear fluid noted in floor, fern slide collected.
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
## 2017-11-27 NOTE — MAU Note (Signed)
Pt came in by EMS, states she found out she was pregnant last month, has no idea how far along she is.  Started having contractions around 0630 this morning, denies bleeding or LOF.  Reports fetal movement.

## 2017-11-28 LAB — HEMOGLOBINOPATHY EVALUATION
HGB C: 0 %
HGB F QUANT: 0 % (ref 0.0–2.0)
Hgb A2 Quant: 2.7 % (ref 1.8–3.2)
Hgb A: 97.3 % (ref 96.4–98.8)
Hgb S Quant: 0 %
Hgb Variant: 0 %

## 2017-11-28 LAB — HIV ANTIBODY (ROUTINE TESTING W REFLEX): HIV Screen 4th Generation wRfx: NONREACTIVE

## 2017-11-28 LAB — HEPATITIS B SURFACE ANTIGEN: HEP B S AG: NEGATIVE

## 2017-11-28 LAB — RPR: RPR: NONREACTIVE

## 2017-11-28 NOTE — Progress Notes (Signed)
POSTPARTUM PROGRESS NOTE  Post Partum Day 1  Subjective:  Tracy Tyler is a 27 y.o. [id number] s/p SVD.  She reports she is doing well. No acute events overnight. She denies any problems with ambulating, voiding or po intake. Denies nausea or vomiting.  Pain is well controlled.  Lochia is moderate.  Objective: Blood pressure 119/68, pulse 63, temperature 98 F (36.7 C), temperature source Oral, resp. rate 16, height 5\' 6"  (1.676 m), weight 165 lb (74.8 kg), SpO2 98 %, unknown if currently breastfeeding.  Physical Exam:  General: alert, cooperative and no distress Chest: no respiratory distress Heart:regular rate, distal pulses intact Abdomen: soft, nontender,  Uterine Fundus: firm, appropriately tender DVT Evaluation: No calf swelling or tenderness Extremities: no edema Skin: warm, dry  Recent Labs    11/27/17 1101  HGB 10.8*  HCT 32.0*    Assessment/Plan: Tracy GinsbergRasheda Shor is a 27 y.o. [id number] s/p SVD  PPD#1: Doing well  Routine postpartum care No PNC: SW consult Contraception: nexplanon Feeding: bottle Dispo: Plan for discharge tomorrow.   LOS: 1 day   Kandra NicolasJulie P DegeleMD 11/28/2017, 7:16 AM

## 2017-11-28 NOTE — Clinical Social Work Maternal (Signed)
CLINICAL SOCIAL WORK MATERNAL/CHILD NOTE  Patient Details  Name: Tracy Tyler MRN: [id number] Date of Birth: May 04, 1990  Date:  11/28/2017  Clinical Social Worker Initiating Note:  Laurey Arrow Date/Time: Initiated:  11/28/17/1028     Child's Name:  Kara Mead   Biological Parents:  Mother, Father   Need for Interpreter:  None   Reason for Referral:      Address:  [place] [address]    Phone number:  [phone] (home)    Additional phone number:   Household Members/Support Persons (HM/SP):   Household Member/Support Person 1, Household Member/Support Person 2, Household Member/Support Person 3   HM/SP Name Relationship DOB or Age  HM/SP -1 Deltha Bernales son 10/25/2004  HM/SP -2 Ty'Mel Savage son 03/08/2013  HM/SP -3 Jah'King Savage son 01/10/2014  HM/SP -4        HM/SP -5        HM/SP -6        HM/SP -7        HM/SP -8          Natural Supports (not living in the home):  Parent, Spouse/significant other   Professional Supports: None, Shelter   Employment: Retail buyer, Unemployed   Type of Work:     Education:      Homebound arranged:    Museum/gallery curator Resources:  Medicaid   Other Resources:  Physicist, medical , [place] Considerations Which May Impact Care:  none reported  Strengths:  Ability to meet basic needs , Home prepared for child , Pediatrician chosen   Psychotropic Medications:         Pediatrician:    [place] area  Pediatrician List:   [facility]  [place]      Pediatrician Fax Number:    Risk Factors/Current Problems:  None   Cognitive State:  Alert , Able to Concentrate , Insightful , Linear Thinking    Mood/Affect:  Bright , Happy , Relaxed , Comfortable , Interested    CSW Assessment: CSW met with MOB in room 111 to complete an assessment for no  PNC.  MOB recognized CSW from MOB's last assessment on 11/23/16 for late [facility].  MOB was polite, easy to engage, and interested in meeting with CSW.   CSW asked about MOB not receiving PNC and MOB stated, "I did not know I as pregnant until last month when I took a pregnancy test. I had my cycle and had no idea that I was pregnant." CSW explained the hospital's policy regarding no PNC; MOB was understanding. MOB denied the use of illicit substance and expressed that MOB was not concerned about infants screens. CSW made MOB aware that infant's UDS was negative and CSW will continue to monitor infant's CDS.  CSW informed MOB that if infant's CDS is positive without an explanation, CSW will make a report to [facility] CPS. MOB denied having any CPS hx with the exception of 13 years ago when MOB was residing in [facility]. MOB communicated MOB is prepared for the infant and has all needed items. MOB also shared that MOB has a good support team that will assist family if needed.   MOB denied barriers to follow-up appointments for MOB and infant.   CSW Plan/Description:  No Further Intervention Required/No Barriers to Discharge, Sudden Infant Death Syndrome (SIDS)  Education, Other Information/Referral to [place], CSW Will Continue to Monitor Umbilical Cord Tissue Drug Screen Results and Make Report if Warranted   Laurey Arrow, MSW, LCSW Clinical Social Work [phone]   Dimple Nanas, LCSW 11/28/2017, 3:36 PM

## 2017-11-29 ENCOUNTER — Encounter (HOSPITAL_COMMUNITY): Payer: Self-pay | Admitting: *Deleted

## 2017-11-29 LAB — GC/CHLAMYDIA PROBE AMP (~~LOC~~) NOT AT ARMC
Chlamydia: NEGATIVE
Neisseria Gonorrhea: NEGATIVE

## 2017-11-29 MED ORDER — IBUPROFEN 600 MG PO TABS: 600.0000 mg | ORAL_TABLET | Freq: Four times a day (QID) | ORAL | 0 refills | Status: AC

## 2017-11-29 NOTE — Discharge Summary (Addendum)
OB Discharge Summary     Patient Name: Tracy GinsbergRasheda Mcclish DOB: [birth year] MRN: [id number]  Date of admission: 11/27/2017 Delivering MD: Aviva SignsWILLIAMS, MARIE L   Date of discharge: 11/29/2017  Admitting diagnosis: CTX Intrauterine pregnancy: Unknown     Secondary diagnosis:  Active Problems:   Normal labor  Additional problems: no/minimal prenatal care. prematurity.  35-[redacted] week gestational age per NICU.      Discharge diagnosis: Preterm Pregnancy Delivered                                                                                                Post partum procedures:none  Augmentation: AROM  Complications: None  Hospital course:  Onset of Labor With Vaginal Delivery     27 y.o. yo [id number] at Unknown was admitted in Active Labor on 11/27/2017. Patient had an uncomplicated labor course as follows:  Membrane Rupture Time/Date: 11:42 AM ,11/27/2017   Intrapartum Procedures: Episiotomy: None [1]                                         Lacerations:  None [1]  Patient had a delivery of a Viable infant. 11/27/2017  Information for the patient's newborn:  Tracy Tyler, Boy Alithea [[id number]][[id number]]  Delivery Method: Vag-Spont    Pateint had an uncomplicated postpartum course.  She is ambulating, tolerating a regular diet, passing flatus, and urinating well. Patient is discharged home in stable condition on 11/29/17.   Physical exam  Vitals:   11/27/17 2259 11/28/17 0530 11/28/17 2216 11/29/17 0531  BP: 128/67 119/68 112/76 122/79  Pulse: 75 63 83 79  Resp: 18 16 17 18   Temp:   97.6 F (36.4 C)   TempSrc:   Oral   SpO2:      Weight:      Height:       General: alert, cooperative and no distress Lochia: appropriate Uterine Fundus: firm Incision: N/A DVT Evaluation: No evidence of DVT seen on physical exam. Labs: Lab Results  Component Value Date   WBC 11.9 (H) 11/27/2017   HGB 10.8 (L) 11/27/2017   HCT 32.0 (L) 11/27/2017   MCV 88.9 11/27/2017   PLT 243 11/27/2017   CMP Latest Ref  Rng & Units 02/09/2015  Glucose 65 - 99 mg/dL 696(E102(H)  BUN 6 - 20 mg/dL 8  Creatinine 9.520.44 - 8.411.00 mg/dL 3.240.83  Sodium 401135 - 027145 mmol/L 140  Potassium 3.5 - 5.1 mmol/L 3.9  Chloride 101 - 111 mmol/L 105  CO2 22 - 32 mmol/L 28  Calcium 8.9 - 10.3 mg/dL 9.6  Total Protein 6.5 - 8.1 g/dL 7.7  Total Bilirubin 0.3 - 1.2 mg/dL 0.5  Alkaline Phos 38 - 126 U/L 57  AST 15 - 41 U/L 23  ALT 14 - 54 U/L 17    Discharge instruction: per After Visit Summary and "Baby and Me Booklet".  After visit meds:  Allergies as of 11/29/2017      Reactions   Penicillins Hives   Has patient had a  PCN reaction causing immediate rash, facial/tongue/throat swelling, SOB or lightheadedness with hypotension: Yes Has patient had a PCN reaction causing severe rash involving mucus membranes or skin necrosis: Yes Has patient had a PCN reaction that required hospitalization: No Has patient had a PCN reaction occurring within the last 10 years: No If all of the above answers are "NO", then may proceed with Cephalosporin use.      Medication List    TAKE these medications   CONCEPT OB 130-92.4-1 MG Caps Take 1 tablet by mouth daily.   ibuprofen 600 MG tablet Commonly known as:  ADVIL,MOTRIN Take 1 tablet (600 mg total) by mouth every 6 (six) hours.       Diet: routine diet  Activity: Advance as tolerated. Pelvic rest for 6 weeks.   Outpatient follow up:4 weeks Follow up Appt:No future appointments. Follow up Visit:No follow-ups on file.  Postpartum contraception: Nexplanon  Newborn Data: Live born female  Birth Weight: 5 lb 7.8 oz (2489 g) APGAR: 9, 9  Newborn Delivery   Birth date/time:  11/27/2017 12:03:00 Delivery type:  Vaginal, Spontaneous     Baby Feeding: Bottle and Breast Disposition:home with mother   11/29/2017 Tracy Kitty, MD  Midwife attestation I have seen and examined this patient and agree with above documentation in the resident's note.   Tracy Tyler is a 27 y.o.  [id number] s/p SVD.  Pain is well controlled. Plan for birth control is Nexplanon. Method of Feeding: bottle  PE:  Gen: well appearing Heart: reg rate Lungs: normal WOB Fundus firm Ext: no pain, no edema  Recent Labs    11/27/17 1101  HGB 10.8*  HCT 32.0*     Assessment S/p SVD PPD # 2  Plan: - discharge home - postpartum care discussed - f/u clinic in 4 weeks for postpartum visit   Donette Larry, CNM 2:00 PM

## 2017-11-29 NOTE — Lactation Note (Signed)
This note was copied from a baby's chart. Lactation Consultation Note; Mom is P5 with no prenatal care this pregnancy. On admission mom states formula. Has been putting baby to the breast occasionally but has refused assist from RNs. No LS noted. On my visit mom resting and answered questions but did not look at me,. States breast feeding is going fine and does not need any assist. BF brochure left for mom.   Patient Name: Boy Carmon GinsbergRasheda Latona ZOXWR'UToday's Date: 11/29/2017 Reason for consult: Initial assessment   Maternal Data Formula Feeding for Exclusion: Yes Reason for exclusion: Mother's choice to formula feed on admision  Feeding Feeding Type: Breast Fed  LATCH Score                   Interventions    Lactation Tools Discussed/Used     Consult Status Consult Status: Complete    Pamelia HoitWeeks, Khy Pitre D 11/29/2017, 10:45 AM

## 2017-11-29 NOTE — Discharge Instructions (Signed)
Vaginal Delivery, Care After °Refer to this sheet in the next few weeks. These instructions provide you with information about caring for yourself after vaginal delivery. Your health care provider may also give you more specific instructions. Your treatment has been planned according to current medical practices, but problems sometimes occur. Call your health care provider if you have any problems or questions. °What can I expect after the procedure? °After vaginal delivery, it is common to have: °· Some bleeding from your vagina. °· Soreness in your abdomen, your vagina, and the area of skin between your vaginal opening and your anus (perineum). °· Pelvic cramps. °· Fatigue. ° °Follow these instructions at home: °Medicines °· Take over-the-counter and prescription medicines only as told by your health care provider. °· If you were prescribed an antibiotic medicine, take it as told by your health care provider. Do not stop taking the antibiotic until it is finished. °Driving ° °· Do not drive or operate heavy machinery while taking prescription pain medicine. °· Do not drive for 24 hours if you received a sedative. °Lifestyle °· Do not drink alcohol. This is especially important if you are breastfeeding or taking medicine to relieve pain. °· Do not use tobacco products, including cigarettes, chewing tobacco, or e-cigarettes. If you need help quitting, ask your health care provider. °Eating and drinking °· Drink at least 8 eight-ounce glasses of water every day unless you are told not to by your health care provider. If you choose to breastfeed your baby, you may need to drink more water than this. °· Eat high-fiber foods every day. These foods may help prevent or relieve constipation. High-fiber foods include: °? Whole grain cereals and breads. °? Schraeder rice. °? Beans. °? Fresh fruits and vegetables. °Activity °· Return to your normal activities as told by your health care provider. Ask your health care provider  what activities are safe for you. °· Rest as much as possible. Try to rest or take a nap when your baby is sleeping. °· Do not lift anything that is heavier than your baby or 10 lb (4.5 kg) until your health care provider says that it is safe. °· Talk with your health care provider about when you can engage in sexual activity. This may depend on your: °? Risk of infection. °? Rate of healing. °? Comfort and desire to engage in sexual activity. °Vaginal Care °· If you have an episiotomy or a vaginal tear, check the area every day for signs of infection. Check for: °? More redness, swelling, or pain. °? More fluid or blood. °? Warmth. °? Pus or a bad smell. °· Do not use tampons or douches until your health care provider says this is safe. °· Watch for any blood clots that may pass from your vagina. These may look like clumps of dark red, Cumpian, or black discharge. °General instructions °· Keep your perineum clean and dry as told by your health care provider. °· Wear loose, comfortable clothing. °· Wipe from front to back when you use the toilet. °· Ask your health care provider if you can shower or take a bath. If you had an episiotomy or a perineal tear during labor and delivery, your health care provider may tell you not to take baths for a certain length of time. °· Wear a bra that supports your breasts and fits you well. °· If possible, have someone help you with household activities and help care for your baby for at least a few days after   you leave the hospital. °· Keep all follow-up visits for you and your baby as told by your health care provider. This is important. °Contact a health care provider if: °· You have: °? Vaginal discharge that has a bad smell. °? Difficulty urinating. °? Pain when urinating. °? A sudden increase or decrease in the frequency of your bowel movements. °? More redness, swelling, or pain around your episiotomy or vaginal tear. °? More fluid or blood coming from your episiotomy or  vaginal tear. °? Pus or a bad smell coming from your episiotomy or vaginal tear. °? A fever. °? A rash. °? Little or no interest in activities you used to enjoy. °? Questions about caring for yourself or your baby. °· Your episiotomy or vaginal tear feels warm to the touch. °· Your episiotomy or vaginal tear is separating or does not appear to be healing. °· Your breasts are painful, hard, or turn red. °· You feel unusually sad or worried. °· You feel nauseous or you vomit. °· You pass large blood clots from your vagina. If you pass a blood clot from your vagina, save it to show to your health care provider. Do not flush blood clots down the toilet without having your health care provider look at them. °· You urinate more than usual. °· You are dizzy or light-headed. °· You have not breastfed at all and you have not had a menstrual period for 12 weeks after delivery. °· You have stopped breastfeeding and you have not had a menstrual period for 12 weeks after you stopped breastfeeding. °Get help right away if: °· You have: °? Pain that does not go away or does not get better with medicine. °? Chest pain. °? Difficulty breathing. °? Blurred vision or spots in your vision. °? Thoughts about hurting yourself or your baby. °· You develop pain in your abdomen or in one of your legs. °· You develop a severe headache. °· You faint. °· You bleed from your vagina so much that you fill two sanitary pads in one hour. °This information is not intended to replace advice given to you by your health care provider. Make sure you discuss any questions you have with your health care provider. °Document Released: 04/15/2000 Document Revised: 09/30/2015 Document Reviewed: 05/03/2015 °Elsevier Interactive Patient Education © 2018 [employer] ° °

## 2017-11-30 ENCOUNTER — Encounter: Payer: Self-pay | Admitting: Advanced Practice Midwife

## 2017-11-30 DIAGNOSIS — O411290 Chorioamnionitis, unspecified trimester, not applicable or unspecified: Secondary | ICD-10-CM | POA: Insufficient documentation

## 2018-01-03 ENCOUNTER — Ambulatory Visit: Payer: Medicaid Other | Admitting: Advanced Practice Midwife

## 2018-01-03 ENCOUNTER — Ambulatory Visit (INDEPENDENT_AMBULATORY_CARE_PROVIDER_SITE_OTHER): Payer: Medicaid Other | Admitting: Advanced Practice Midwife

## 2018-01-03 DIAGNOSIS — Z3046 Encounter for surveillance of implantable subdermal contraceptive: Secondary | ICD-10-CM

## 2018-01-03 DIAGNOSIS — Z308 Encounter for other contraceptive management: Secondary | ICD-10-CM

## 2018-01-03 DIAGNOSIS — Z1389 Encounter for screening for other disorder: Secondary | ICD-10-CM

## 2018-01-03 MED ORDER — ETONOGESTREL 68 MG ~~LOC~~ IMPL
68.0000 mg | DRUG_IMPLANT | Freq: Once | SUBCUTANEOUS | Status: AC
  Administered 2018-01-03: 68 mg via SUBCUTANEOUS

## 2018-01-03 NOTE — Progress Notes (Signed)
Subjective:     Tracy Tyler is a 27 y.o. female who presents for a postpartum visit. She is 6 weeks postpartum following a spontaneous vaginal delivery. I have fully reviewed the prenatal and intrapartum course. The delivery was at  35-36 gestational weeks. Outcome: spontaneous vaginal delivery. Anesthesia: none. Postpartum course has been unremarkable. Baby's course has been unremarkable. Baby is feeding by both breast and bottle - Similac Neosure. Bleeding no bleeding. Bowel function is normal. Bladder function is normal. Patient is not sexually active. Contraception method is Nexplanon. Postpartum depression screening: negative.  The following portions of the patient's history were reviewed and updated as appropriate: allergies, current medications, past family history, past medical history, past social history, past surgical history and problem list.  Review of Systems A comprehensive review of systems was negative.   Objective:    There were no vitals taken for this visit.  General:  alert, cooperative, appears stated age and no distress   Breasts:  inspection negative, no nipple discharge or bleeding, no masses or nodularity palpable  Lungs: clear to auscultation bilaterally  Heart:  regular rate and rhythm, S1, S2 normal, no murmur, click, rub or gallop  Abdomen: soft, non-tender; bowel sounds normal; no masses,  no organomegaly   Vulva:  not evaluated  Vagina: normal vagina, no discharge, exudate, lesion, or erythema  Cervix:  no cervical motion tenderness and no lesions  Corpus: normal size, contour, position, consistency, mobility, non-tender  Adnexa:  no mass, fullness, tenderness  Rectal Exam: Not performed.        Nexplanon Insertion Procedure Patient identified, informed consent performed, consent signed.   Patient does understand that irregular bleeding is a very common side effect of this medication. She was advised to have backup contraception for one week after placement.  Pregnancy test in clinic today was negative.  Appropriate time out taken.  Patient's right arm was prepped and draped in the usual sterile fashion. The ruler used to measure and mark insertion area.  Patient was prepped with alcohol swab and then injected with 3 ml of 1% lidocaine.  She was prepped with betadine, Nexplanon removed from packaging,  Device confirmed in needle, then inserted full length of needle and withdrawn per handbook instructions. Nexplanon was able to palpated in the patient's arm; patient palpated the insert herself. There was minimal blood loss.  Patient insertion site covered with guaze and a pressure bandage to reduce any bruising.  The patient tolerated the procedure well and was given post procedure instructions.    Assessment:    Normal postpartum exam. Pap smear not done at today's visit, last performed 06/2016  Plan:    1. Contraception: Nexplanon 2. Follow up in: 1 year or as needed.    Clayton Bibles, CNM 01/03/18 4:12 PM

## 2018-08-30 ENCOUNTER — Encounter: Payer: Self-pay | Admitting: *Deleted

## 2018-09-08 ENCOUNTER — Encounter (HOSPITAL_COMMUNITY): Payer: Self-pay

## 2018-09-08 ENCOUNTER — Emergency Department (HOSPITAL_COMMUNITY)
Admission: EM | Admit: 2018-09-08 | Discharge: 2018-09-08 | Disposition: A | Discharge status: Home / Self Care | Payer: Medicaid Other | Attending: Emergency Medicine | Admitting: Emergency Medicine

## 2018-09-08 ENCOUNTER — Other Ambulatory Visit: Payer: Self-pay

## 2018-09-08 DIAGNOSIS — Z79899 Other long term (current) drug therapy: Secondary | ICD-10-CM | POA: Insufficient documentation

## 2018-09-08 DIAGNOSIS — N898 Other specified noninflammatory disorders of vagina: Secondary | ICD-10-CM | POA: Insufficient documentation

## 2018-09-08 DIAGNOSIS — F17210 Nicotine dependence, cigarettes, uncomplicated: Secondary | ICD-10-CM | POA: Insufficient documentation

## 2018-09-08 DIAGNOSIS — J45909 Unspecified asthma, uncomplicated: Secondary | ICD-10-CM | POA: Insufficient documentation

## 2018-09-08 DIAGNOSIS — Z202 Contact with and (suspected) exposure to infections with a predominantly sexual mode of transmission: Secondary | ICD-10-CM

## 2018-09-08 LAB — URINALYSIS, ROUTINE W REFLEX MICROSCOPIC
Bacteria, UA: NONE SEEN
Bilirubin Urine: NEGATIVE
Glucose, UA: NEGATIVE mg/dL
Ketones, ur: NEGATIVE mg/dL
Leukocytes,Ua: NEGATIVE
Nitrite: NEGATIVE
Protein, ur: NEGATIVE mg/dL
Specific Gravity, Urine: 1.019 (ref 1.005–1.030)
pH: 6 (ref 5.0–8.0)

## 2018-09-08 LAB — WET PREP, GENITAL
Sperm: NONE SEEN
Trich, Wet Prep: NONE SEEN
Yeast Wet Prep HPF POC: NONE SEEN

## 2018-09-08 LAB — PREGNANCY, URINE: Preg Test, Ur: NEGATIVE

## 2018-09-08 MED ORDER — AZITHROMYCIN 250 MG PO TABS
1000.0000 mg | ORAL_TABLET | Freq: Once | ORAL | Status: DC
  Filled 2018-09-08: qty 4

## 2018-09-08 MED ORDER — CEFTRIAXONE SODIUM 250 MG IJ SOLR
250.0000 mg | Freq: Once | INTRAMUSCULAR | Status: DC
  Filled 2018-09-08: qty 250

## 2018-09-08 MED ORDER — METRONIDAZOLE 500 MG PO TABS: 500.0000 mg | ORAL_TABLET | Freq: Two times a day (BID) | ORAL | 0 refills | Status: AC

## 2018-09-08 MED ORDER — STERILE WATER FOR INJECTION IJ SOLN
INTRAMUSCULAR | Status: AC
  Filled 2018-09-08: qty 10

## 2018-09-08 NOTE — ED Triage Notes (Signed)
Pt reported she is unable to void at this time.

## 2018-09-08 NOTE — ED Notes (Signed)
Not pt vitals

## 2018-09-08 NOTE — Discharge Instructions (Addendum)
Today you were NOT treated for gonorrhea, chlamydia, results will be available via Mychart at the end of the week.  Please refrain from sexual intercourse for the next 3 weeks.  I have also prescribed antibiotics to help treat the bacterial vaginosis, please take 1 tablet twice a day for the next 7 days.  If you experience any worsening symptoms, fevers, no improvement please return to the emergency department.
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
## 2018-09-10 LAB — GC/CHLAMYDIA PROBE AMP (~~LOC~~) NOT AT ARMC
Chlamydia: NEGATIVE
Neisseria Gonorrhea: NEGATIVE

## 2019-02-07 IMAGING — US US MFM OB TRANSVAGINAL
1 series · 13 of 28 positions shown · non-contrast
Comparison: none

[Series 1: us mfm ob transvaginal · 45 acquisitions, 13 frames shown]
[im 2/45]
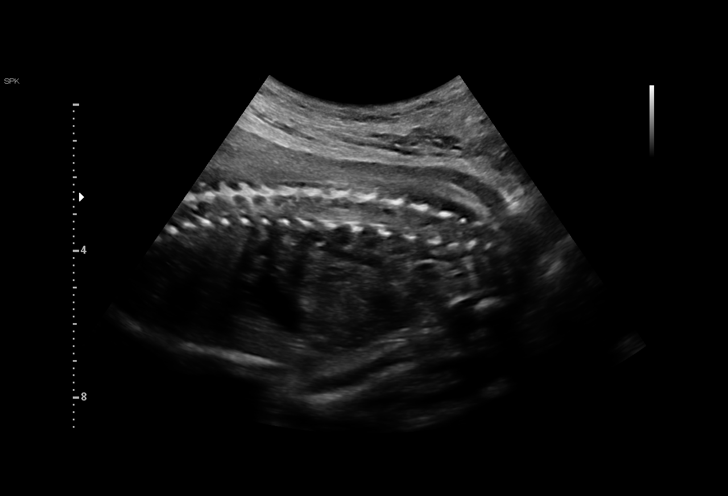
[im 5/45]
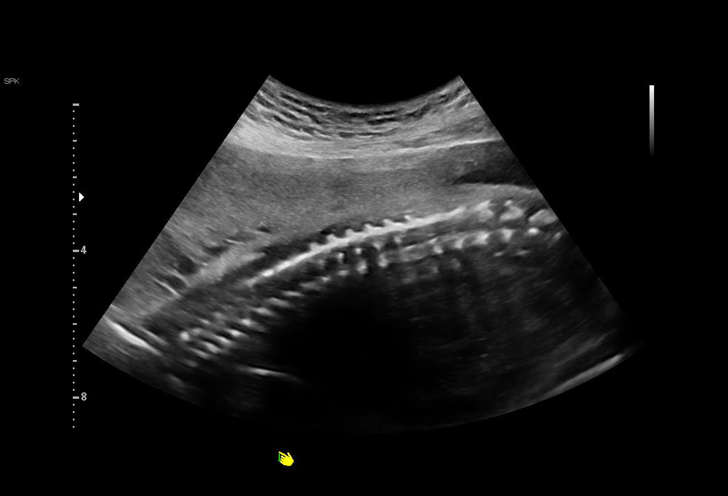
[im 9/45]
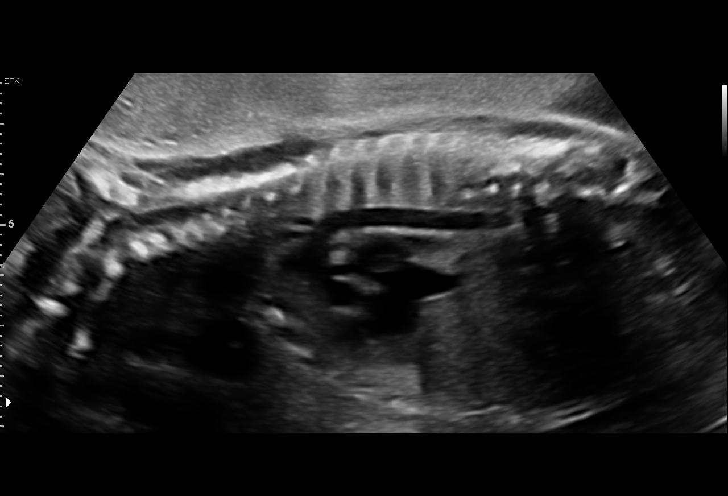
[im 12/45]
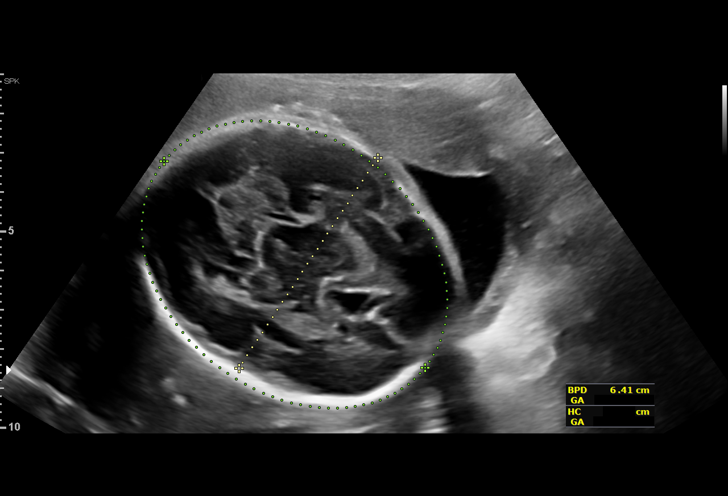
[im 15/45]
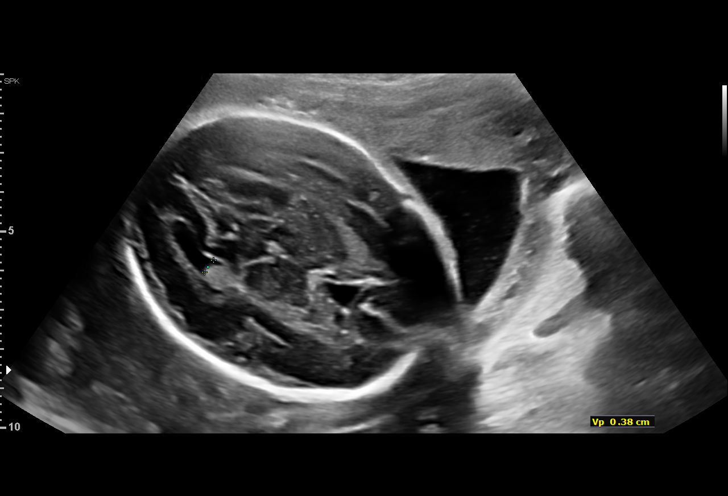
[im 18/45]
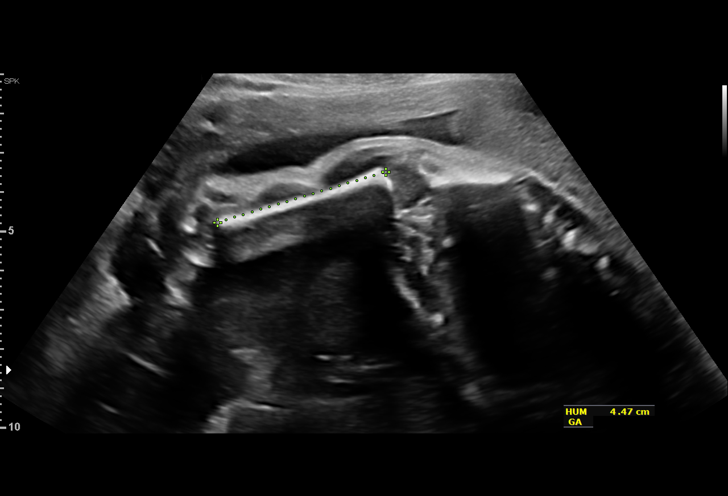
[im 23/45]
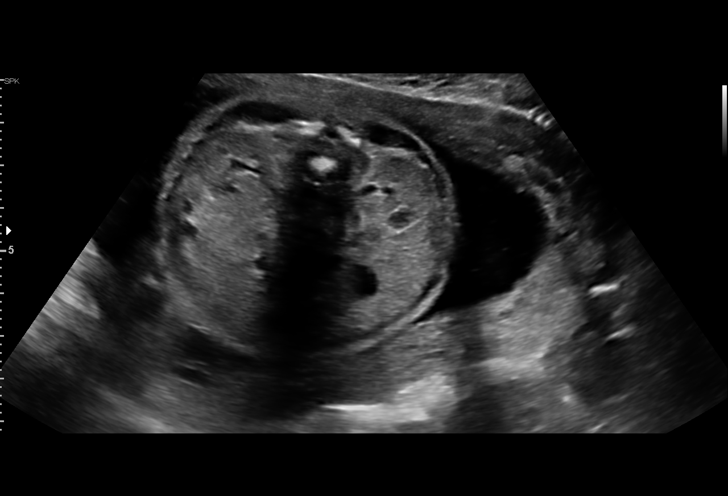
[im 27/45]
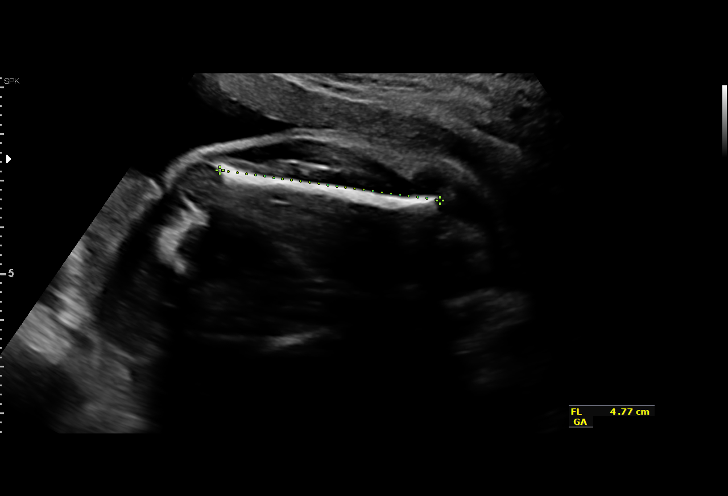
[im 30/45]
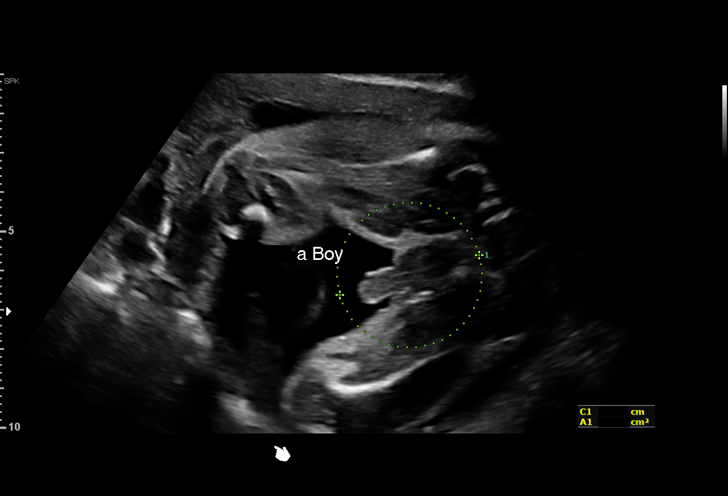
[im 33/45]
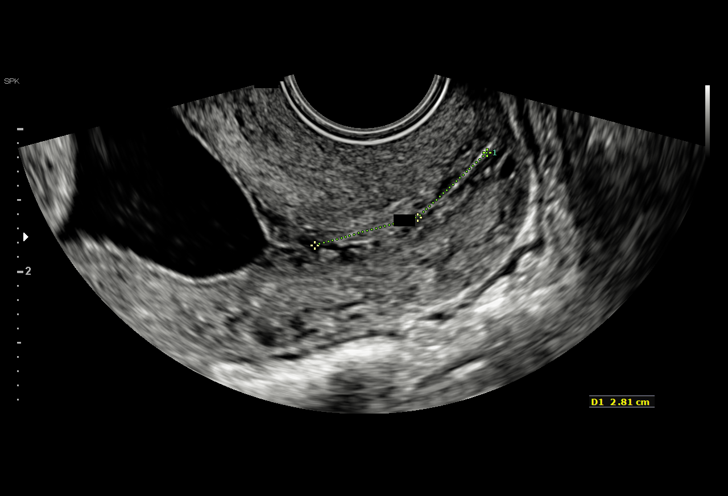
[im 36/45]
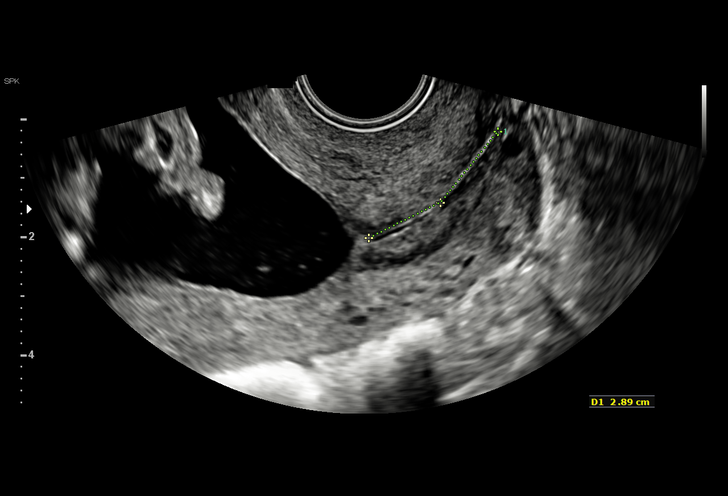
[im 40/45]
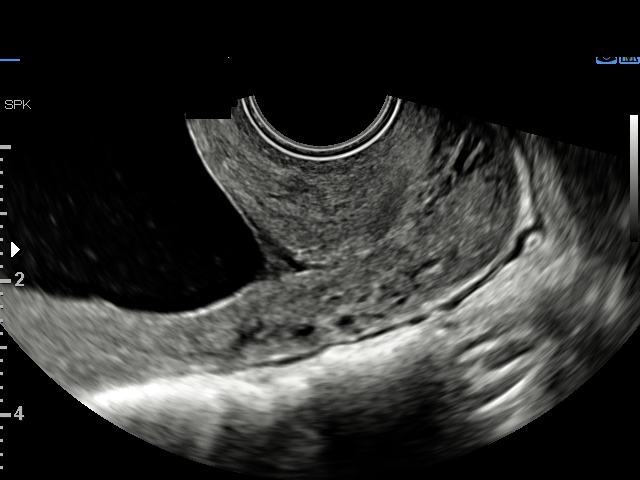
[im 43/45]
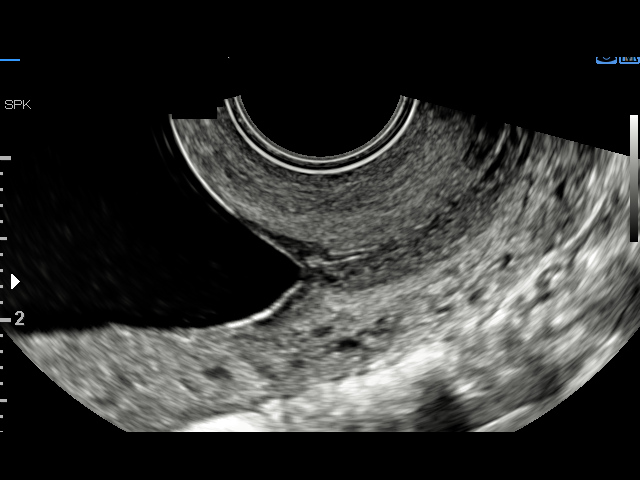

[13 of 28 positions shown; findings below may reference images not displayed]

Attending:        Abou Malak Welkenhuysen       Secondary Phy.:   [facility]-
MAU/Triage

1  [facility]            [id number]      [id number]     [id number]
2  [facility]            [id number]      [id number]     [id number]
Indications

26 weeks gestation of pregnancy
Poor obstetric history: Previous preterm
delivery at 27 and 
 21 wks, antepartum
Poor obstetric history: Previous neonatal
death
Tobacco use complicating pregnancy,
second trimester
Drug use complicating pregnancy, second
trimester (+ THC)
OB History

Gravidity:    5         Term:   2        Prem:   2        SAB:   0
TOP:          0       Ectopic:  0        Living: 3
Fetal Evaluation

Num Of Fetuses:     1
Fetal Heart         129
Rate(bpm):
Cardiac Activity:   Observed
Presentation:       Breech
Placenta:           Anterior, above cervical os

Amniotic Fluid
AFI FV:      Subjectively within normal limits

Largest Pocket(cm)
4.87
Biometry

BPD:      64.2  mm     G. Age:  26w 0d         20  %    CI:        74.71   %    70 - 86
FL/HC:      19.6   %    18.6 -
HC:      235.7  mm     G. Age:  25w 4d          6  %    HC/AC:      1.13        1.05 -
AC:      207.8  mm     G. Age:  25w 3d         11  %    FL/BPD:     71.8   %    71 - 87
FL:       46.1  mm     G. Age:  25w 2d          8  %    FL/AC:      22.2   %    20 - 24
HUM:      44.5  mm     G. Age:  26w 3d         43  %

Est. FW:     804  gm    1 lb 12 oz      27  %
Gestational Age

LMP:           29w 1d        Date:  02/02/16                 EDD:   11/08/16
U/S Today:     25w 4d                                        EDD:   12/03/16
Best:          26w 4d     Det. By:  U/S  (06/30/16)          EDD:   11/26/16
Anatomy

Cranium:               Appears normal         Aortic Arch:            Previously seen
Cavum:                 Appears normal         Ductal Arch:            Previously seen
Ventricles:            Appears normal         Diaphragm:              Appears normal
Choroid Plexus:        Previously seen        Stomach:                Appears normal, left
sided
Cerebellum:            Appears normal         Abdomen:                Appears normal
Posterior Fossa:       Appears normal         Abdominal Wall:         Previously seen
Nuchal Fold:           Not applicable (>20    Cord Vessels:           Appears normal (3
wks GA)                                        vessel cord)
Face:                  Orbits and profile     Kidneys:                Appear normal
previously seen
Lips:                  Previously seen        Bladder:                Appears normal
Thoracic:              Appears normal         Spine:                  Appears normal
Heart:                 Appears normal         Upper Extremities:      Previously seen
(4CH, axis, and
situs)
RVOT:                  Previously seen        Lower Extremities:      Previously seen
LVOT:                  Previously seen

Other:  Fetus appears to be a male. Heels and Left 5th digit previously
visualized. Nasal bone previously visualized. Technically difficult due
to fetal position.
Cervix Uterus Adnexa

Cervix
Length:            2.8  cm.
Normal appearance by transvaginal scan
Impression

SIUP at 33w8d
active singleton fetus
EFW 27th%'le
no dysmorphic features are demonstrated
amniotic fluid volume is gestational age appropriate
cervical length is technically long and closed measuring
2.8cm (albeit this is decreased from last exam)
no previa
Recommendations

Cervical length in 1 week (scheduled).  If less than 2.5cm,
begin vaginal progesterone qhs until 36 wks for short cervix
preterm labor precautions
smoking cessation
interval growth in 3 weeks (scheduled)

## 2021-06-22 ENCOUNTER — Ambulatory Visit: Payer: Self-pay | Admitting: Family Medicine

## 2022-07-11 ENCOUNTER — Ambulatory Visit: Payer: Self-pay | Admitting: Obstetrics and Gynecology

## 2022-09-06 ENCOUNTER — Emergency Department (HOSPITAL_COMMUNITY): Payer: BLUE CROSS/BLUE SHIELD

## 2022-09-06 ENCOUNTER — Emergency Department (HOSPITAL_COMMUNITY)
Admission: EM | Admit: 2022-09-06 | Discharge: 2022-09-06 | Disposition: A | Discharge status: Home / Self Care | Payer: BLUE CROSS/BLUE SHIELD | Attending: Emergency Medicine | Admitting: Emergency Medicine

## 2022-09-06 ENCOUNTER — Other Ambulatory Visit: Payer: Self-pay

## 2022-09-06 ENCOUNTER — Encounter (HOSPITAL_COMMUNITY): Payer: Self-pay | Admitting: Emergency Medicine

## 2022-09-06 DIAGNOSIS — J45909 Unspecified asthma, uncomplicated: Secondary | ICD-10-CM | POA: Diagnosis not present

## 2022-09-06 DIAGNOSIS — R000 Tachycardia, unspecified: Secondary | ICD-10-CM | POA: Insufficient documentation

## 2022-09-06 DIAGNOSIS — R0789 Other chest pain: Secondary | ICD-10-CM | POA: Diagnosis not present

## 2022-09-06 DIAGNOSIS — R109 Unspecified abdominal pain: Secondary | ICD-10-CM | POA: Diagnosis present

## 2022-09-06 DIAGNOSIS — R1012 Left upper quadrant pain: Secondary | ICD-10-CM | POA: Diagnosis not present

## 2022-09-06 LAB — COMPREHENSIVE METABOLIC PANEL
ALT: 13 U/L (ref 0–44)
AST: 26 U/L (ref 15–41)
Albumin: 4.3 g/dL (ref 3.5–5.0)
Alkaline Phosphatase: 70 U/L (ref 38–126)
Anion gap: 12 (ref 5–15)
BUN: 10 mg/dL (ref 6–20)
CO2: 20 mmol/L — ABNORMAL LOW (ref 22–32)
Calcium: 9.4 mg/dL (ref 8.9–10.3)
Chloride: 105 mmol/L (ref 98–111)
Creatinine, Ser: 1.04 mg/dL — ABNORMAL HIGH (ref 0.44–1.00)
GFR, Estimated: >60 mL/min (ref >60)
Glucose, Bld: 125 mg/dL — ABNORMAL HIGH (ref 70–99)
Potassium: 3.7 mmol/L (ref 3.5–5.1)
Sodium: 137 mmol/L (ref 135–145)
Total Bilirubin: 0.2 mg/dL — ABNORMAL LOW (ref 0.3–1.2)
Total Protein: 7.3 g/dL (ref 6.5–8.1)

## 2022-09-06 LAB — I-STAT BETA HCG BLOOD, ED (MC, WL, AP ONLY): I-stat hCG, quantitative: <5 m[IU]/mL (ref <5)

## 2022-09-06 LAB — CBC WITH DIFFERENTIAL/PLATELET
Abs Immature Granulocytes: 0.03 10*3/uL (ref 0.00–0.07)
Basophils Absolute: 0.1 10*3/uL (ref 0.0–0.1)
Basophils Relative: 0 %
Eosinophils Absolute: 0.3 10*3/uL (ref 0.0–0.5)
Eosinophils Relative: 3 %
HCT: 38.7 % (ref 36.0–46.0)
Hemoglobin: 12.6 g/dL (ref 12.0–15.0)
Immature Granulocytes: 0 %
Lymphocytes Relative: 34 %
Lymphs Abs: 3.8 10*3/uL (ref 0.7–4.0)
MCH: 29.2 pg (ref 26.0–34.0)
MCHC: 32.6 g/dL (ref 30.0–36.0)
MCV: 89.6 fL (ref 80.0–100.0)
Monocytes Absolute: 0.6 10*3/uL (ref 0.1–1.0)
Monocytes Relative: 5 %
Neutro Abs: 6.6 10*3/uL (ref 1.7–7.7)
Neutrophils Relative %: 58 %
Platelets: 303 10*3/uL (ref 150–400)
RBC: 4.32 MIL/uL (ref 3.87–5.11)
RDW: 13.6 % (ref 11.5–15.5)
WBC: 11.3 10*3/uL — ABNORMAL HIGH (ref 4.0–10.5)
nRBC: 0 % (ref 0.0–0.2)

## 2022-09-06 LAB — LIPASE, BLOOD: Lipase: 27 U/L (ref 11–51)

## 2022-09-06 MED ORDER — IOHEXOL 350 MG/ML SOLN
75.0000 mL | Freq: Once | INTRAVENOUS | Status: AC | PRN
  Administered 2022-09-06: 75 mL via INTRAVENOUS

## 2022-09-06 MED ORDER — FENTANYL CITRATE PF 50 MCG/ML IJ SOSY
50.0000 ug | PREFILLED_SYRINGE | Freq: Once | INTRAMUSCULAR | Status: AC
  Administered 2022-09-06: 50 ug via INTRAVENOUS
  Filled 2022-09-06: qty 1

## 2022-09-06 MED ORDER — KETOROLAC TROMETHAMINE 15 MG/ML IJ SOLN
15.0000 mg | Freq: Once | INTRAMUSCULAR | Status: AC
  Administered 2022-09-06: 15 mg via INTRAVENOUS
  Filled 2022-09-06: qty 1

## 2022-09-06 MED ORDER — SODIUM CHLORIDE 0.9 % IV BOLUS
500.0000 mL | Freq: Once | INTRAVENOUS | Status: AC
  Administered 2022-09-06: 500 mL via INTRAVENOUS
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
## 2022-09-06 NOTE — Discharge Instructions (Signed)
Please follow-up with your primary care physician regarding recent ER visit.  Continue to take ibuprofen and Tylenol as needed for pain.  Return if you have any further concerns or questions.
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
